# Patient Record
Sex: Female | Born: 1985 | Race: Black or African American | Hispanic: No | Marital: Single | Smoking: Light tobacco smoker
Health system: Southern US, Community
[De-identification: ages and names within clinical notes are randomized; demographics above are authoritative.]

## PROBLEM LIST (undated history)

## (undated) ENCOUNTER — Inpatient Hospital Stay (HOSPITAL_COMMUNITY): Payer: Self-pay

## (undated) DIAGNOSIS — R51 Headache: Secondary | ICD-10-CM

## (undated) DIAGNOSIS — R87619 Unspecified abnormal cytological findings in specimens from cervix uteri: Secondary | ICD-10-CM

## (undated) DIAGNOSIS — IMO0002 Reserved for concepts with insufficient information to code with codable children: Secondary | ICD-10-CM

## (undated) DIAGNOSIS — D649 Anemia, unspecified: Secondary | ICD-10-CM

## (undated) HISTORY — PX: WISDOM TOOTH EXTRACTION: SHX21

## (undated) HISTORY — PX: NO PAST SURGERIES: SHX2092

---
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
## 2007-02-09 ENCOUNTER — Emergency Department (HOSPITAL_COMMUNITY): Admission: EM | Admit: 2007-02-09 | Discharge: 2007-02-09 | Payer: Self-pay | Admitting: Emergency Medicine

## 2007-06-11 ENCOUNTER — Emergency Department (HOSPITAL_COMMUNITY): Admission: EM | Admit: 2007-06-11 | Discharge: 2007-06-11 | Payer: Self-pay | Admitting: Emergency Medicine

## 2007-08-19 ENCOUNTER — Emergency Department (HOSPITAL_COMMUNITY): Admission: EM | Admit: 2007-08-19 | Discharge: 2007-08-19 | Payer: Self-pay | Admitting: Neurosurgery

## 2007-10-05 ENCOUNTER — Emergency Department (HOSPITAL_COMMUNITY): Admission: EM | Admit: 2007-10-05 | Discharge: 2007-10-05 | Payer: Self-pay | Admitting: Family Medicine

## 2008-07-14 ENCOUNTER — Emergency Department (HOSPITAL_COMMUNITY): Admission: EM | Admit: 2008-07-14 | Discharge: 2008-07-15 | Payer: Self-pay | Admitting: Emergency Medicine

## 2008-07-16 ENCOUNTER — Encounter: Payer: Self-pay | Admitting: Emergency Medicine

## 2008-07-17 ENCOUNTER — Ambulatory Visit: Payer: Self-pay | Admitting: Gynecology

## 2008-07-17 ENCOUNTER — Inpatient Hospital Stay (HOSPITAL_COMMUNITY): Admission: AD | Admit: 2008-07-17 | Discharge: 2008-07-18 | Payer: Self-pay | Admitting: Gynecology

## 2008-07-25 ENCOUNTER — Ambulatory Visit: Payer: Self-pay | Admitting: Gynecology

## 2008-08-03 ENCOUNTER — Ambulatory Visit: Payer: Self-pay | Admitting: Gynecology

## 2008-09-17 ENCOUNTER — Emergency Department (HOSPITAL_COMMUNITY): Admission: EM | Admit: 2008-09-17 | Discharge: 2008-09-17 | Payer: Self-pay | Admitting: Emergency Medicine

## 2008-10-05 ENCOUNTER — Inpatient Hospital Stay (HOSPITAL_COMMUNITY): Admission: AD | Admit: 2008-10-05 | Discharge: 2008-10-06 | Payer: Self-pay | Admitting: Obstetrics & Gynecology

## 2008-10-08 ENCOUNTER — Inpatient Hospital Stay (HOSPITAL_COMMUNITY): Admission: AD | Admit: 2008-10-08 | Discharge: 2008-10-08 | Payer: Self-pay | Admitting: Obstetrics & Gynecology

## 2009-07-16 ENCOUNTER — Ambulatory Visit (HOSPITAL_COMMUNITY): Admission: RE | Admit: 2009-07-16 | Discharge: 2009-07-16 | Payer: Self-pay | Admitting: Obstetrics & Gynecology

## 2009-08-30 ENCOUNTER — Ambulatory Visit (HOSPITAL_COMMUNITY): Admission: RE | Admit: 2009-08-30 | Discharge: 2009-08-30 | Payer: Self-pay | Admitting: Family Medicine

## 2009-11-08 ENCOUNTER — Emergency Department (HOSPITAL_COMMUNITY): Admission: EM | Admit: 2009-11-08 | Discharge: 2009-11-09 | Payer: Self-pay | Admitting: Emergency Medicine

## 2010-01-15 ENCOUNTER — Ambulatory Visit: Payer: Self-pay | Admitting: Advanced Practice Midwife

## 2010-01-15 ENCOUNTER — Inpatient Hospital Stay (HOSPITAL_COMMUNITY): Admission: AD | Admit: 2010-01-15 | Discharge: 2010-01-17 | Payer: Self-pay | Admitting: Obstetrics & Gynecology

## 2010-03-25 ENCOUNTER — Emergency Department (HOSPITAL_COMMUNITY)
Admission: EM | Admit: 2010-03-25 | Discharge: 2010-03-25 | Payer: Self-pay | Source: Home / Self Care | Admitting: Family Medicine

## 2010-04-07 ENCOUNTER — Encounter: Payer: Self-pay | Admitting: Emergency Medicine

## 2010-04-07 ENCOUNTER — Encounter: Payer: Self-pay | Admitting: Gynecology

## 2010-05-27 LAB — CBC
Hemoglobin: 10.1 g/dL — ABNORMAL LOW (ref 12.0–15.0)
MCH: 31.9 pg (ref 26.0–34.0)
Platelets: 147 10*3/uL — ABNORMAL LOW (ref 150–400)
RBC: 3.17 MIL/uL — ABNORMAL LOW (ref 3.87–5.11)
RDW: 13 % (ref 11.5–15.5)
WBC: 18.6 10*3/uL — ABNORMAL HIGH (ref 4.0–10.5)

## 2010-05-27 LAB — RPR: RPR Ser Ql: NONREACTIVE

## 2010-06-22 LAB — WET PREP, GENITAL
Clue Cells Wet Prep HPF POC: NONE SEEN
Trich, Wet Prep: NONE SEEN

## 2010-06-22 LAB — COMPREHENSIVE METABOLIC PANEL
ALT: 11 U/L (ref 0–35)
Alkaline Phosphatase: 56 U/L (ref 39–117)
BUN: 8 mg/dL (ref 6–23)
CO2: 23 mEq/L (ref 19–32)
GFR calc non Af Amer: >60 mL/min (ref >60)
Glucose, Bld: 128 mg/dL — ABNORMAL HIGH (ref 70–99)
Potassium: 3.2 mEq/L — ABNORMAL LOW (ref 3.5–5.1)
Sodium: 136 mEq/L (ref 135–145)

## 2010-06-22 LAB — DIFFERENTIAL
Basophils Absolute: 0 10*3/uL (ref 0.0–0.1)
Eosinophils Absolute: 0 10*3/uL (ref 0.0–0.7)
Lymphs Abs: 0.4 10*3/uL — ABNORMAL LOW (ref 0.7–4.0)
Monocytes Absolute: 0.2 10*3/uL (ref 0.1–1.0)
Neutrophils Relative %: 97 % — ABNORMAL HIGH (ref 43–77)
WBC Morphology: INCREASED

## 2010-06-22 LAB — URINALYSIS, ROUTINE W REFLEX MICROSCOPIC
Bilirubin Urine: NEGATIVE
Glucose, UA: NEGATIVE mg/dL
Hgb urine dipstick: NEGATIVE
Ketones, ur: NEGATIVE mg/dL
Nitrite: NEGATIVE
Protein, ur: 100 mg/dL — AB
Specific Gravity, Urine: 1.02 (ref 1.005–1.030)
Specific Gravity, Urine: 1.046 — ABNORMAL HIGH (ref 1.005–1.030)
Urobilinogen, UA: 0.2 mg/dL (ref 0.0–1.0)
Urobilinogen, UA: 1 mg/dL (ref 0.0–1.0)
pH: 7.5 (ref 5.0–8.0)

## 2010-06-22 LAB — GC/CHLAMYDIA PROBE AMP, GENITAL
Chlamydia, DNA Probe: NEGATIVE
GC Probe Amp, Genital: NEGATIVE
GC Probe Amp, Genital: POSITIVE — AB

## 2010-06-22 LAB — CBC
HCT: 38.6 % (ref 36.0–46.0)
Hemoglobin: 13.1 g/dL (ref 12.0–15.0)
MCHC: 34.1 g/dL (ref 30.0–36.0)
MCHC: 34 g/dL (ref 30.0–36.0)
MCV: 91.2 fL (ref 78.0–100.0)
Platelets: 289 10*3/uL (ref 150–400)
RBC: 4.24 MIL/uL (ref 3.87–5.11)
RDW: 14.1 % (ref 11.5–15.5)

## 2010-06-22 LAB — URINE MICROSCOPIC-ADD ON

## 2010-06-22 LAB — POCT PREGNANCY, URINE
Preg Test, Ur: NEGATIVE
Preg Test, Ur: NEGATIVE

## 2010-06-24 LAB — URINE CULTURE

## 2010-06-24 LAB — GC/CHLAMYDIA PROBE AMP, GENITAL
Chlamydia, DNA Probe: NEGATIVE
GC Probe Amp, Genital: POSITIVE — AB

## 2010-06-24 LAB — HIV ANTIBODY (ROUTINE TESTING W REFLEX): HIV: NONREACTIVE

## 2010-06-24 LAB — URINALYSIS, ROUTINE W REFLEX MICROSCOPIC
Glucose, UA: NEGATIVE mg/dL
Hgb urine dipstick: NEGATIVE
Ketones, ur: 15 mg/dL — AB
Ketones, ur: 15 mg/dL — AB
Protein, ur: 100 mg/dL — AB
Urobilinogen, UA: 1 mg/dL (ref 0.0–1.0)
pH: 7 (ref 5.0–8.0)

## 2010-06-24 LAB — URINE MICROSCOPIC-ADD ON

## 2010-06-24 LAB — COMPREHENSIVE METABOLIC PANEL
Albumin: 3.9 g/dL (ref 3.5–5.2)
BUN: 10 mg/dL (ref 6–23)
Chloride: 98 mEq/L (ref 96–112)
Creatinine, Ser: 0.95 mg/dL (ref 0.4–1.2)
GFR calc non Af Amer: >60 mL/min (ref >60)
Total Bilirubin: 1.2 mg/dL (ref 0.3–1.2)

## 2010-06-24 LAB — PREGNANCY, URINE: Preg Test, Ur: NEGATIVE

## 2010-06-24 LAB — CBC
HCT: 30.9 % — ABNORMAL LOW (ref 36.0–46.0)
HCT: 39.6 % (ref 36.0–46.0)
MCHC: 34.2 g/dL (ref 30.0–36.0)
MCV: 90.8 fL (ref 78.0–100.0)
Platelets: 234 10*3/uL (ref 150–400)
Platelets: 246 10*3/uL (ref 150–400)
RDW: 13.5 % (ref 11.5–15.5)
WBC: 23.2 10*3/uL — ABNORMAL HIGH (ref 4.0–10.5)

## 2010-06-24 LAB — WET PREP, GENITAL: Yeast Wet Prep HPF POC: NONE SEEN

## 2010-06-24 LAB — POCT PREGNANCY, URINE: Preg Test, Ur: NEGATIVE

## 2010-06-24 LAB — RPR: RPR Ser Ql: NONREACTIVE

## 2010-06-24 LAB — DIFFERENTIAL
Basophils Absolute: 0 10*3/uL (ref 0.0–0.1)
Lymphocytes Relative: 4 % — ABNORMAL LOW (ref 12–46)
Monocytes Relative: 4 % (ref 3–12)
WBC Morphology: INCREASED

## 2010-07-29 NOTE — H&P (Signed)
[name], [name]              ACCOUNT NO.:  [id number]   MEDICAL RECORD NO.:  [id number]          PATIENT TYPE:  INP   LOCATION:  9317                          FACILITY:  [facility]   PHYSICIAN:  Juan H. Lily Peer, M.D.DATE OF BIRTH:  [birth year]   DATE OF ADMISSION:  07/17/2008  DATE OF DISCHARGE:                              HISTORY & PHYSICAL   CHIEF COMPLAINT:  Lower abdominal pain.   HISTORY:  The patient is a 24 year old gravida 1, para 1, who had been  seen at [facility], 2 days prior to the admission to the  hospital and was treated for suspected urinary tract infection and at  that time, she had received a wet prep, GC, and chlamydia culture and  chlamydia culture were not available and we did not find out until she  presented back to the hospital on Saturday morning, complaining of  worsening of low pelvic pain and her GC cultures found to be positive.  She was found to have a white blood count of 23,000 with 92% neutrophil.  Her comprehensive metabolic panel was normal.  Her urine demonstrated  some blood, some trace bilirubin, ketones, and leukoesterase of small  amount.  Urine pregnancy test had been normal.  She had a CT of the  abdomen and pelvis.  The lung base appeared to be otherwise normal.  The  rest of the abdominal CT was normal.  There is a suspicion with mild  mesenteric edema.  The patient had administration rectal contrast to  identify the appendix.  It was not able to be visualized in the previous  pelvic CT scan.  The appendix appeared to be within normal limits in  size.  The distal small-bowel was unremarkable.  The remainder of the  colons reported to be normal and moderate fluid was seen again, and the  uterus and adnexa appeared to be otherwise unremarkable.  Of note, the  patient when she was discharged on the first day in the emergency room,  we will suspect a UTI and bacterial vaginosis.  She had been placed on  ciprofloxacin and Flagyl,  which stated it upset her stomach.  The  patient had stable vital signs, temperature was 100.3 in the emergency  room at [facility] and she was transferred to [facility] for further  treatment.  She did received while she was there, doxycycline 100 mg IV  and cefotetan 2 g IV.   PAST MEDICAL HISTORY:  She smokes cigarettes.  Denies alcohol usage.  She had been on Depo-Provera injectable contraception, but her last dose  was approximately 3-1/2 months ago, when she started she did not want to  use this formal contraception due to the fact that she had been gaining  weight.   PHYSICAL EXAMINATION:  GENERAL:  Well-developed, well-nourished female  in no apparent acute distress.  This morning her temperature is 98.2,  blood pressure 116/54, respiration 14, and pulse 98.  HEENT:  Unremarkable.  NECK:  Supple.  Trachea midline.  No carotid bruits or thyromegaly.  LUNGS:  Clear to auscultation without rhonchi or wheezes.  HEART:  Regular rate and rhythm.  No murmurs or gallops.  BREASTS:  Exam not done.  ABDOMEN:  Soft, tender, and bilateral, right greater than left.  Good  bowel sounds and poor quadrant.  Pelvic exam was not repeated and was done in the emergency room, which  had been demonstrated as normal external genitalia and beefy-looking  cervix with right adnexal tenderness and no discharge noted.  RECTAL:  Exam not done.  EXTREMITIES: No edema.   ASSESSMENT:  A 25 year old gravida 1, para 1, with full-blown PID.  Positive GC culture.  HIV and RPR was drawn this morning.  The patient  was started on Unasyn 3 g IV q.6 h. this morning along with doxycycline  100 mg b.i.d.  I had a discussion with the patient and her boyfriend  about the positive GC culture and that the above-mentioned antibiotics  will cover the above-mentioned organism as well as a broader coverage  that we anticipate her be in the hospital for 72 hours for intravenous  antibiotic and then to continue at home with  oral antibiotics for  approximately 2 weeks and be followed as an outpatient.  We will decide  of on Thursday morning.  We will do a pelvic ultrasound before discharge  home.  We will keep her at bedrest, continue to monitor vital signs,  continued the above-mentioned antibiotics, and will repeat her CBC  probably tomorrow morning and along with sed rate and follow  accordingly.   PLAN:  As per assessment above.      Juan H. Lily Peer, M.D.  Electronically Signed     JHF/MEDQ  D:  07/17/2008  T:  07/17/2008  Job:  [id number]

## 2010-12-12 LAB — POCT PREGNANCY, URINE
Operator id: 239701
Preg Test, Ur: NEGATIVE

## 2010-12-12 LAB — POCT URINALYSIS DIP (DEVICE)
Hgb urine dipstick: NEGATIVE
Ketones, ur: NEGATIVE
Protein, ur: NEGATIVE
pH: 7.5

## 2010-12-12 LAB — WET PREP, GENITAL

## 2010-12-12 LAB — GC/CHLAMYDIA PROBE AMP, GENITAL: Chlamydia, DNA Probe: POSITIVE — AB

## 2011-11-04 ENCOUNTER — Encounter (HOSPITAL_COMMUNITY): Payer: Self-pay | Admitting: *Deleted

## 2011-11-04 ENCOUNTER — Emergency Department (HOSPITAL_COMMUNITY)
Admission: EM | Admit: 2011-11-04 | Discharge: 2011-11-04 | Disposition: A | Discharge status: Home / Self Care | Payer: Medicaid Other | Source: Home / Self Care | Attending: Emergency Medicine | Admitting: Emergency Medicine

## 2011-11-04 DIAGNOSIS — Z3490 Encounter for supervision of normal pregnancy, unspecified, unspecified trimester: Secondary | ICD-10-CM

## 2011-11-04 DIAGNOSIS — Z331 Pregnant state, incidental: Secondary | ICD-10-CM

## 2011-11-04 LAB — POCT URINALYSIS DIP (DEVICE)
Hgb urine dipstick: NEGATIVE
Protein, ur: 30 mg/dL — AB
Specific Gravity, Urine: 1.02 (ref 1.005–1.030)
Urobilinogen, UA: 0.2 mg/dL (ref 0.0–1.0)
pH: 7 (ref 5.0–8.0)

## 2011-11-04 LAB — POCT PREGNANCY, URINE: Preg Test, Ur: POSITIVE — AB

## 2011-11-04 MED ORDER — PRENATAL MULTIVIT-IRON PO TABS: 1.0000 | ORAL_TABLET | ORAL | Status: DC

## 2011-11-04 NOTE — ED Provider Notes (Signed)
History     CSN: [id number]  Arrival date & time 11/04/11  1546   First MD Initiated Contact with Patient 11/04/11 1616      Chief Complaint  Patient presents with  . Possible Pregnancy    (Consider location/radiation/quality/duration/timing/severity/associated sxs/prior treatment) HPI Comments: Patient presents urgent care requesting to be tested for pregnancy she had a positive pregnancy test at home. About a week ago she says) requiring proof of pregnancy in order to the able to establish with an obstetrician for prenatal care. Patient admits to mild nausea. Denies any vaginal bleeding or abdominal pain.  The history is provided by the patient.    History reviewed. No pertinent past medical history.  History reviewed. No pertinent past surgical history.  No family history on file.  History  Substance Use Topics  . Smoking status: Not on file  . Smokeless tobacco: Not on file  . Alcohol Use: Not on file    OB History    Grav Para Term Preterm Abortions TAB SAB Ect Mult Living                  Review of Systems  Constitutional: Negative for fever, chills, activity change and fatigue.  Cardiovascular: Negative for chest pain.  Gastrointestinal: Negative for nausea and abdominal pain.  Genitourinary: Negative for dysuria, vaginal bleeding, vaginal discharge, menstrual problem and pelvic pain.  Musculoskeletal: Negative for back pain.    Allergies  Review of patient's allergies indicates no known allergies.  Home Medications   Current Outpatient Rx  Name Route Sig Dispense Refill  . PRENATAL MULTIVIT-IRON PO TABS Oral Take 1 tablet by mouth 1 day or 1 dose. 60 tablet 2    BP 121/76  Pulse 86  Temp 98.4 F (36.9 C) (Oral)  Resp 16  SpO2 98%  LMP 09/16/2011  Physical Exam  Nursing note and vitals reviewed. Constitutional: Vital signs are normal. She appears well-developed and well-nourished.  Non-toxic appearance. She does not have a sickly appearance.  She does not appear ill.  Abdominal: Soft. She exhibits no distension. There is no tenderness.  Neurological: She is alert.  Skin: Skin is warm.    ED Course  Procedures (including critical care time)  Labs Reviewed  POCT URINALYSIS DIP (DEVICE) - Abnormal; Notable for the following:    Protein, ur 30 (*)     Leukocytes, UA TRACE (*)  Biochemical Testing Only. Please order routine urinalysis from main lab if confirmatory testing is needed.   All other components within normal limits  POCT PREGNANCY, URINE - Abnormal; Notable for the following:    Preg Test, Ur POSITIVE (*)     All other components within normal limits   No results found.   1. Pregnancy       MDM  Uncomplicated early pregnancy. Patient is comfortable. Asymptomatic. Has plans to establish with prenatal care with [facility], started on prenatal vitamins. Patient was instructed about what symptoms should warrant her evaluation at the [facility] facility.        Jimmie Molly, MD 11/04/11 1729

## 2011-11-04 NOTE — ED Notes (Signed)
Pt  Reports  Late on  Her  Period  And     Had  A  Pos  preg test  At  Home  -  She  Is  Here  Today  C/o  Some  wekness  But  No   Vomiting no bleeding or  Any pain     She  Needs  Documentation   To  [facility]

## 2011-11-17 LAB — OB RESULTS CONSOLE ANTIBODY SCREEN: Antibody Screen: NEGATIVE

## 2011-11-17 LAB — OB RESULTS CONSOLE HEPATITIS B SURFACE ANTIGEN: Hepatitis B Surface Ag: NEGATIVE

## 2011-11-17 LAB — OB RESULTS CONSOLE RPR: RPR: NONREACTIVE

## 2011-11-24 ENCOUNTER — Other Ambulatory Visit: Payer: Self-pay | Admitting: Obstetrics & Gynecology

## 2012-03-16 NOTE — L&D Delivery Note (Signed)
Delivery Note At 10:31 AM a viable female was delivered via Vaginal, Spontaneous Delivery (Presentation: Right Occiput Anterior).  APGAR: , ; weight .   Placenta status: Intact, Manual removal.  Cord: 3 vessels with the following complications: None.  Cord pH: not done  Anesthesia: None  Episiotomy: None Lacerations: None Suture Repair: 2.0 Est. Blood Loss (mL): 350  Mom to postpartum.  Baby to nursery-stable.  MARSHALL,BERNARD A 06/11/2012, 10:48 AM

## 2012-06-11 ENCOUNTER — Encounter (HOSPITAL_COMMUNITY)
Admission: AD | Disposition: A | Discharge status: Home / Self Care | Payer: Self-pay | Source: Ambulatory Visit | Attending: Obstetrics

## 2012-06-11 ENCOUNTER — Encounter (HOSPITAL_COMMUNITY): Payer: Self-pay | Admitting: *Deleted

## 2012-06-11 ENCOUNTER — Inpatient Hospital Stay (HOSPITAL_COMMUNITY): Payer: Medicaid Other

## 2012-06-11 ENCOUNTER — Inpatient Hospital Stay (HOSPITAL_COMMUNITY)
Admission: AD | Admit: 2012-06-11 | Discharge: 2012-06-13 | DRG: 767 | Disposition: A | Discharge status: Home / Self Care | Payer: Medicaid Other | Source: Ambulatory Visit | Attending: Obstetrics | Admitting: Obstetrics

## 2012-06-11 ENCOUNTER — Encounter (HOSPITAL_COMMUNITY): Payer: Self-pay

## 2012-06-11 DIAGNOSIS — O9903 Anemia complicating the puerperium: Secondary | ICD-10-CM | POA: Diagnosis not present

## 2012-06-11 DIAGNOSIS — D62 Acute posthemorrhagic anemia: Secondary | ICD-10-CM | POA: Diagnosis not present

## 2012-06-11 HISTORY — DX: Unspecified abnormal cytological findings in specimens from cervix uteri: R87.619

## 2012-06-11 HISTORY — PX: DILATION AND EVACUATION: SHX1459

## 2012-06-11 HISTORY — DX: Reserved for concepts with insufficient information to code with codable children: IMO0002

## 2012-06-11 HISTORY — DX: Headache: R51

## 2012-06-11 LAB — CBC
HCT: 36.3 % (ref 36.0–46.0)
HCT: 30.8 % — ABNORMAL LOW (ref 36.0–46.0)
Hemoglobin: 12.6 g/dL (ref 12.0–15.0)
Hemoglobin: 10.4 g/dL — ABNORMAL LOW (ref 12.0–15.0)
MCH: 31.2 pg (ref 26.0–34.0)
MCHC: 34.7 g/dL (ref 30.0–36.0)
MCHC: 33.8 g/dL (ref 30.0–36.0)
MCV: 89.9 fL (ref 78.0–100.0)
RBC: 3.38 MIL/uL — ABNORMAL LOW (ref 3.87–5.11)
RDW: 13.3 % (ref 11.5–15.5)

## 2012-06-11 LAB — PREPARE RBC (CROSSMATCH)

## 2012-06-11 LAB — MRSA PCR SCREENING: MRSA by PCR: NEGATIVE

## 2012-06-11 SURGERY — DILATION AND EVACUATION, UTERUS
Anesthesia: Monitor Anesthesia Care | Site: Uterus | Wound class: Clean Contaminated

## 2012-06-11 MED ORDER — MEPERIDINE HCL 25 MG/ML IJ SOLN: 6.2500 mg | INTRAMUSCULAR | Status: DC | PRN

## 2012-06-11 MED ORDER — ONDANSETRON HCL 4 MG PO TABS: 4.0000 mg | ORAL_TABLET | ORAL | Status: DC | PRN

## 2012-06-11 MED ORDER — LANOLIN HYDROUS EX OINT: TOPICAL_OINTMENT | CUTANEOUS | Status: DC | PRN

## 2012-06-11 MED ORDER — OXYTOCIN BOLUS FROM INFUSION: 500.0000 mL | INTRAVENOUS | Status: DC

## 2012-06-11 MED ORDER — MIDAZOLAM HCL 5 MG/5ML IJ SOLN
INTRAMUSCULAR | Status: DC | PRN
  Administered 2012-06-11 (×2): 2 mg via INTRAVENOUS

## 2012-06-11 MED ORDER — PROMETHAZINE HCL 25 MG/ML IJ SOLN: 6.2500 mg | INTRAMUSCULAR | Status: DC | PRN

## 2012-06-11 MED ORDER — LACTATED RINGERS IV SOLN: 500.0000 mL | INTRAVENOUS | Status: DC | PRN

## 2012-06-11 MED ORDER — METHYLERGONOVINE MALEATE 0.2 MG/ML IJ SOLN
INTRAMUSCULAR | Status: AC
  Administered 2012-06-11: 0.2 mg via INTRAMUSCULAR
  Filled 2012-06-11: qty 1

## 2012-06-11 MED ORDER — FERROUS SULFATE 325 (65 FE) MG PO TABS
325.0000 mg | ORAL_TABLET | Freq: Two times a day (BID) | ORAL | Status: DC
  Administered 2012-06-11 – 2012-06-12 (×2): 325 mg via ORAL
  Filled 2012-06-11 (×2): qty 1

## 2012-06-11 MED ORDER — TETANUS-DIPHTH-ACELL PERTUSSIS 5-2.5-18.5 LF-MCG/0.5 IM SUSP
0.5000 mL | Freq: Once | INTRAMUSCULAR | Status: AC
  Administered 2012-06-12: 0.5 mL via INTRAMUSCULAR
  Filled 2012-06-11: qty 0.5

## 2012-06-11 MED ORDER — IBUPROFEN 600 MG PO TABS
600.0000 mg | ORAL_TABLET | Freq: Four times a day (QID) | ORAL | Status: DC | PRN
  Filled 2012-06-11 (×3): qty 1

## 2012-06-11 MED ORDER — LIDOCAINE HCL (PF) 1 % IJ SOLN: 30.0000 mL | INTRAMUSCULAR | Status: DC | PRN

## 2012-06-11 MED ORDER — LACTATED RINGERS IV SOLN
INTRAVENOUS | Status: DC
  Administered 2012-06-11: 09:00:00 via INTRAVENOUS

## 2012-06-11 MED ORDER — CITRIC ACID-SODIUM CITRATE 334-500 MG/5ML PO SOLN
30.0000 mL | ORAL | Status: DC | PRN
  Filled 2012-06-11: qty 15

## 2012-06-11 MED ORDER — PROPOFOL 10 MG/ML IV EMUL
INTRAVENOUS | Status: DC | PRN
  Administered 2012-06-11 (×2): 20 mg via INTRAVENOUS
  Administered 2012-06-11 (×3): 40 mg via INTRAVENOUS
  Administered 2012-06-11 (×2): 20 mg via INTRAVENOUS
  Administered 2012-06-11: 40 mg via INTRAVENOUS

## 2012-06-11 MED ORDER — CHLOROPROCAINE HCL 1 % IJ SOLN
INTRAMUSCULAR | Status: AC
  Filled 2012-06-11: qty 60

## 2012-06-11 MED ORDER — BENZOCAINE-MENTHOL 20-0.5 % EX AERO: 1.0000 "application " | INHALATION_SPRAY | CUTANEOUS | Status: DC | PRN

## 2012-06-11 MED ORDER — ONDANSETRON HCL 4 MG/2ML IJ SOLN
INTRAMUSCULAR | Status: DC | PRN
  Administered 2012-06-11: 4 mg via INTRAVENOUS

## 2012-06-11 MED ORDER — HYDROMORPHONE HCL PF 1 MG/ML IJ SOLN: 0.2500 mg | INTRAMUSCULAR | Status: DC | PRN

## 2012-06-11 MED ORDER — PRENATAL MULTIVITAMIN CH
1.0000 | ORAL_TABLET | Freq: Every day | ORAL | Status: DC
  Administered 2012-06-12: 1 via ORAL
  Filled 2012-06-11: qty 1

## 2012-06-11 MED ORDER — DIBUCAINE 1 % RE OINT: 1.0000 "application " | TOPICAL_OINTMENT | RECTAL | Status: DC | PRN

## 2012-06-11 MED ORDER — OXYCODONE-ACETAMINOPHEN 5-325 MG PO TABS
1.0000 | ORAL_TABLET | ORAL | Status: DC | PRN
  Administered 2012-06-11 – 2012-06-12 (×2): 2 via ORAL
  Administered 2012-06-12: 1 via ORAL
  Filled 2012-06-11 (×2): qty 1

## 2012-06-11 MED ORDER — IBUPROFEN 600 MG PO TABS
600.0000 mg | ORAL_TABLET | Freq: Four times a day (QID) | ORAL | Status: DC
  Administered 2012-06-11 – 2012-06-13 (×7): 600 mg via ORAL
  Filled 2012-06-11 (×4): qty 1

## 2012-06-11 MED ORDER — FAMOTIDINE 20 MG PO TABS: 20.0000 mg | ORAL_TABLET | Freq: Every day | ORAL | Status: DC

## 2012-06-11 MED ORDER — FLEET ENEMA 7-19 GM/118ML RE ENEM: 1.0000 | ENEMA | RECTAL | Status: DC | PRN

## 2012-06-11 MED ORDER — PHENYLEPHRINE HCL 10 MG/ML IJ SOLN
INTRAMUSCULAR | Status: DC | PRN
  Administered 2012-06-11 (×3): 40 mg via INTRAVENOUS

## 2012-06-11 MED ORDER — ONDANSETRON HCL 4 MG/2ML IJ SOLN: 4.0000 mg | INTRAMUSCULAR | Status: DC | PRN

## 2012-06-11 MED ORDER — MIDAZOLAM HCL 2 MG/2ML IJ SOLN
INTRAMUSCULAR | Status: AC
  Filled 2012-06-11: qty 2

## 2012-06-11 MED ORDER — FENTANYL CITRATE 0.05 MG/ML IJ SOLN
INTRAMUSCULAR | Status: DC | PRN
  Administered 2012-06-11 (×5): 50 ug via INTRAVENOUS

## 2012-06-11 MED ORDER — OXYTOCIN 40 UNITS IN LACTATED RINGERS INFUSION - SIMPLE MED
62.5000 mL/h | INTRAVENOUS | Status: DC
  Administered 2012-06-11: 62.5 mL/h via INTRAVENOUS

## 2012-06-11 MED ORDER — LACTATED RINGERS IV SOLN
INTRAVENOUS | Status: DC
  Administered 2012-06-11: via INTRAVENOUS

## 2012-06-11 MED ORDER — PROPOFOL 10 MG/ML IV EMUL
INTRAVENOUS | Status: AC
  Filled 2012-06-11: qty 20

## 2012-06-11 MED ORDER — CHLOROPROCAINE HCL 3 % IJ SOLN
INTRAMUSCULAR | Status: DC | PRN
  Administered 2012-06-11: 40 mL

## 2012-06-11 MED ORDER — LACTATED RINGERS IV BOLUS (SEPSIS): 500.0000 mL | Freq: Once | INTRAVENOUS | Status: DC

## 2012-06-11 MED ORDER — SODIUM CHLORIDE 0.9 % IJ SOLN
15.0000 mL | INTRAMUSCULAR | Status: DC | PRN
Start: 2012-06-11 — End: 2012-06-13

## 2012-06-11 MED ORDER — 0.9 % SODIUM CHLORIDE (POUR BTL) OPTIME
TOPICAL | Status: DC | PRN
  Administered 2012-06-11: 1000 mL

## 2012-06-11 MED ORDER — DIPHENHYDRAMINE HCL 25 MG PO CAPS: 25.0000 mg | ORAL_CAPSULE | Freq: Four times a day (QID) | ORAL | Status: DC | PRN

## 2012-06-11 MED ORDER — FENTANYL CITRATE 0.05 MG/ML IJ SOLN
INTRAMUSCULAR | Status: AC
  Filled 2012-06-11: qty 5

## 2012-06-11 MED ORDER — LACTATED RINGERS IV SOLN
INTRAVENOUS | Status: DC | PRN
  Administered 2012-06-11 (×3): via INTRAVENOUS

## 2012-06-11 MED ORDER — CEFAZOLIN SODIUM-DEXTROSE 2-3 GM-% IV SOLR
INTRAVENOUS | Status: DC | PRN
  Administered 2012-06-11: 2 g via INTRAVENOUS

## 2012-06-11 MED ORDER — PENICILLIN G POTASSIUM 5000000 UNITS IJ SOLR
2.5000 10*6.[IU] | INTRAVENOUS | Status: DC
  Filled 2012-06-11 (×5): qty 2.5

## 2012-06-11 MED ORDER — ACETAMINOPHEN 325 MG PO TABS: 650.0000 mg | ORAL_TABLET | ORAL | Status: DC | PRN

## 2012-06-11 MED ORDER — WITCH HAZEL-GLYCERIN EX PADS: 1.0000 "application " | MEDICATED_PAD | CUTANEOUS | Status: DC | PRN

## 2012-06-11 MED ORDER — BUTORPHANOL TARTRATE 1 MG/ML IJ SOLN
1.0000 mg | INTRAMUSCULAR | Status: DC | PRN
  Administered 2012-06-11: 1 mg via INTRAVENOUS
  Filled 2012-06-11: qty 1

## 2012-06-11 MED ORDER — ZOLPIDEM TARTRATE 5 MG PO TABS: 5.0000 mg | ORAL_TABLET | Freq: Every evening | ORAL | Status: DC | PRN

## 2012-06-11 MED ORDER — SENNOSIDES-DOCUSATE SODIUM 8.6-50 MG PO TABS
2.0000 | ORAL_TABLET | Freq: Every day | ORAL | Status: DC
  Administered 2012-06-11 – 2012-06-12 (×2): 2 via ORAL

## 2012-06-11 MED ORDER — PHENYLEPHRINE 40 MCG/ML (10ML) SYRINGE FOR IV PUSH (FOR BLOOD PRESSURE SUPPORT)
PREFILLED_SYRINGE | INTRAVENOUS | Status: AC
  Administered 2012-06-11: 40 ug
  Filled 2012-06-11: qty 5

## 2012-06-11 MED ORDER — SIMETHICONE 80 MG PO CHEW: 80.0000 mg | CHEWABLE_TABLET | ORAL | Status: DC | PRN

## 2012-06-11 MED ORDER — CITRIC ACID-SODIUM CITRATE 334-500 MG/5ML PO SOLN
30.0000 mL | Freq: Once | ORAL | Status: AC
  Administered 2012-06-11: 30 mL via ORAL

## 2012-06-11 MED ORDER — KETOROLAC TROMETHAMINE 30 MG/ML IJ SOLN: 15.0000 mg | Freq: Once | INTRAMUSCULAR | Status: DC | PRN

## 2012-06-11 MED ORDER — PENICILLIN G POTASSIUM 5000000 UNITS IJ SOLR
5.0000 10*6.[IU] | Freq: Once | INTRAVENOUS | Status: AC
  Administered 2012-06-11: 5 10*6.[IU] via INTRAVENOUS
  Filled 2012-06-11: qty 5

## 2012-06-11 MED ORDER — CEFAZOLIN SODIUM-DEXTROSE 2-3 GM-% IV SOLR
INTRAVENOUS | Status: AC
  Filled 2012-06-11: qty 50

## 2012-06-11 MED ORDER — OXYCODONE-ACETAMINOPHEN 5-325 MG PO TABS
1.0000 | ORAL_TABLET | ORAL | Status: DC | PRN
  Administered 2012-06-11 (×2): 1 via ORAL
  Filled 2012-06-11: qty 1
  Filled 2012-06-11 (×3): qty 2

## 2012-06-11 MED ORDER — OXYTOCIN 40 UNITS IN LACTATED RINGERS INFUSION - SIMPLE MED
INTRAVENOUS | Status: AC
  Filled 2012-06-11: qty 1000

## 2012-06-11 MED ORDER — SODIUM CHLORIDE 0.9 % IR SOLN: 10.0000 mL | Status: DC | PRN

## 2012-06-11 MED ORDER — ONDANSETRON HCL 4 MG/2ML IJ SOLN: 4.0000 mg | Freq: Four times a day (QID) | INTRAMUSCULAR | Status: DC | PRN

## 2012-06-11 SURGICAL SUPPLY — 31 items
ADAPTER VACURETTE TBG SET 14 (CANNULA) ×2 IMPLANT
BAG URINE DRAINAGE (UROLOGICAL SUPPLIES) ×1 IMPLANT
BALLN POSTPARTUM SOS BAKRI (BALLOONS) ×4
BALLOON POSTPARTUM SOS BAKRI (BALLOONS) IMPLANT
CATH ROBINSON RED A/P 16FR (CATHETERS) ×1 IMPLANT
CLOTH BEACON ORANGE TIMEOUT ST (SAFETY) ×2 IMPLANT
DECANTER SPIKE VIAL GLASS SM (MISCELLANEOUS) ×2 IMPLANT
ELECT REM PT RETURN 9FT ADLT (ELECTROSURGICAL) ×2
ELECTRODE REM PT RTRN 9FT ADLT (ELECTROSURGICAL) IMPLANT
GAUZE SPONGE 4X4 16PLY XRAY LF (GAUZE/BANDAGES/DRESSINGS) ×1 IMPLANT
GLOVE BIO SURGEON STRL SZ8.5 (GLOVE) ×2 IMPLANT
GOWN PREVENTION PLUS XXLARGE (GOWN DISPOSABLE) ×2 IMPLANT
GOWN STRL REIN XL XLG (GOWN DISPOSABLE) ×4 IMPLANT
KIT BERKELEY 1ST TRIMESTER 3/8 (MISCELLANEOUS) ×2 IMPLANT
NDL SPNL 22GX3.5 QUINCKE BK (NEEDLE) ×1 IMPLANT
NEEDLE SPNL 22GX3.5 QUINCKE BK (NEEDLE) ×2 IMPLANT
NS IRRIG 1000ML POUR BTL (IV SOLUTION) ×2 IMPLANT
PACK VAGINAL MINOR WOMEN LF (CUSTOM PROCEDURE TRAY) ×2 IMPLANT
PAD OB MATERNITY 4.3X12.25 (PERSONAL CARE ITEMS) ×2 IMPLANT
PAD PREP 24X48 CUFFED NSTRL (MISCELLANEOUS) ×2 IMPLANT
PENCIL BUTTON HOLSTER BLD 10FT (ELECTRODE) ×1 IMPLANT
SET BERKELEY SUCTION TUBING (SUCTIONS) ×2 IMPLANT
SYR CONTROL 10ML LL (SYRINGE) ×2 IMPLANT
TOWEL OR 17X24 6PK STRL BLUE (TOWEL DISPOSABLE) ×4 IMPLANT
TUBING NON-CON 1/4 X 20 CONN (TUBING) ×1 IMPLANT
VACURETTE 10 RIGID CVD (CANNULA) IMPLANT
VACURETTE 12 RIGID CVD (CANNULA) ×1 IMPLANT
VACURETTE 7MM CVD STRL WRAP (CANNULA) IMPLANT
VACURETTE 8 RIGID CVD (CANNULA) IMPLANT
VACURETTE 9 RIGID CVD (CANNULA) IMPLANT
YANKAUER SUCT BULB TIP NO VENT (SUCTIONS) ×1 IMPLANT

## 2012-06-11 NOTE — Progress Notes (Signed)
Amount adjusted per Dr. Gaynell Face EBL

## 2012-06-11 NOTE — Progress Notes (Signed)
By the nurse at 11:15 AM stating that patient had heavy bleeding pelvic exam was done the fundus firm lochia300 cc of clots removed from the vagina and the patient observed 20 minutes later she still had significant bleeding vital signs were normal but gradually her blood pressure came down to 80 50 at that   and the postpartum hemorrhage  protocal was began she was typed and crossed for 2 units of packed cells and CBC performed and  another IV was started in her right  Arm  in 1000 cc of normal saline administered and the patient prepped for the operating room

## 2012-06-11 NOTE — MAU Note (Signed)
Contractions during the night, not able to sleep.  No bleeding or leaking.  Denies prob with preg.

## 2012-06-11 NOTE — H&P (Signed)
This is Dr. Francoise Ceo dictating the history and physical on blank blank she's a 27 year old gravida 3 para 002 EDC 06/22/2012 at 14 and 5 GBS unknown she received penicillin she was admitted in labor 9 cm 100% rapid progress normal and delivery by the midline placenta spontaneous intact by me there were no lacerations and she was delivered of a female team in attendance Apgar unknown at this time Past medical history negative Past surgical history negative Social history negative System review noncontributory Physical exam well-developed female post delivery HEENT negative Breasts negative Heart regular rhythm no murmurs no gallops Abdomen uterus 20 week postpartum size Pelvic as described above Extremities negative and

## 2012-06-11 NOTE — Anesthesia Postprocedure Evaluation (Signed)
Anesthesia Post Note  Patient: [name]  Procedure(s) Performed: Procedure(s) (LRB): DILATATION AND EVACUATION (N/A)  Anesthesia type: MAC  Patient location: PACU  Post pain: Pain level controlled  Post assessment: Post-op Vital signs reviewed  Last Vitals:  Filed Vitals:   06/11/12 1430  BP: 130/68  Pulse: 62  Temp: 37.1 C  Resp: 12    Post vital signs: Reviewed  Level of consciousness: sedated  Complications: No apparent anesthesia complications

## 2012-06-11 NOTE — Transfer of Care (Signed)
Immediate Anesthesia Transfer of Care Note  Patient: [name]  Procedure(s) Performed: Procedure(s): DILATATION AND EVACUATION (N/A)  Patient Location: PACU  Anesthesia Type:MAC  Level of Consciousness: awake, alert  and oriented  Airway & Oxygen Therapy: Patient Spontanous Breathing and Patient connected to nasal cannula oxygen  Post-op Assessment: Report given to PACU RN and Post -op Vital signs reviewed and stable  Post vital signs: Reviewed and stable  Complications: No apparent anesthesia complications

## 2012-06-11 NOTE — MAU Note (Signed)
Called L&D and then pharmacy looking for PCN.

## 2012-06-11 NOTE — Anesthesia Postprocedure Evaluation (Signed)
  Anesthesia Post-op Note  Patient: [name]  Procedure(s) Performed: Procedure(s) with comments: DILATATION AND EVACUATION (N/A) - with insertion Bakri Balloon  - Emergency  Patient Location: ICU  Anesthesia Type:MAC  Level of Consciousness: awake  Airway and Oxygen Therapy: Patient Spontanous Breathing  Post-op Pain: mild  Post-op Assessment: Patient's Cardiovascular Status Stable and Respiratory Function Stable  Post-op Vital Signs: stable  Complications: No apparent anesthesia complications

## 2012-06-11 NOTE — Anesthesia Preprocedure Evaluation (Signed)
Anesthesia Evaluation  Patient identified by MRN, date of birth, ID band Patient awake    Reviewed: Allergy & Precautions, H&P , NPO status , Patient's Chart, lab work & pertinent test results, reviewed documented beta blocker date and time   Airway Mallampati: I TM Distance: >3 FB Neck ROM: full    Dental no notable dental hx. (+) Teeth Intact   Pulmonary neg pulmonary ROS,    Pulmonary exam normal       Cardiovascular negative cardio ROS      Neuro/Psych negative psych ROS   GI/Hepatic negative GI ROS, Neg liver ROS,   Endo/Other  negative endocrine ROS  Renal/GU negative Renal ROS  negative genitourinary   Musculoskeletal negative musculoskeletal ROS (+)   Abdominal Normal abdominal exam  (+)   Peds negative pediatric ROS (+)  Hematology negative hematology ROS (+)   Anesthesia Other Findings   Reproductive/Obstetrics                           Anesthesia Physical Anesthesia Plan  ASA: II and emergent  Anesthesia Plan: MAC   Post-op Pain Management:    Induction: Intravenous  Airway Management Planned:   Additional Equipment:   Intra-op Plan:   Post-operative Plan:   Informed Consent: I have reviewed the patients History and Physical, chart, labs and discussed the procedure including the risks, benefits and alternatives for the proposed anesthesia with the patient or authorized representative who has indicated his/her understanding and acceptance.     Plan Discussed with: CRNA and Surgeon  Anesthesia Plan Comments:         Anesthesia Quick Evaluation

## 2012-06-11 NOTE — Progress Notes (Signed)
1145 Called to room to assist preparing pt for OR procedure , Dr Gaynell Face at [facility]. PT alert and talking with labor RN, Paul Dykes at her side evaluating fundus and bleeding. Pt c/o feeling lightheaded. Pt had HOB flat. Pt has husband at [place] comforting her. BP evaluated and shown to DR Gaynell Face as he talks to OR for procedure time. 1148 Methergine given IM while orders being received from Dr Lilli Light and Dr Arby Barrette updated via phone. Dr Arby Barrette also updated on last 2 BP's. 1149 Dr Gaynell Face called OR and 2nd team called in. Dr Arby Barrette notified of new BP. Charge RN notified of pt status. PT diaphoretic but awake and talking and new orders being received.Dr Gaynell Face continues at [facility]. Oxygen 10L via NRMask. 1150 2nd IV NS started by TFeir RNC-OB in Right arm/wrist, pt tolerated well. PP hemmorhage code called. Consulting civil engineer at [employer]. House coverage notified. 1151 Consent signe, dr Arby Barrette, PPhemm team, lab at [facility], foley placed. 1151 pt to OR via bed.

## 2012-06-11 NOTE — Op Note (Signed)
preop diagnosis postpartum hemorrhage  Post op dx   Retained  Tissue Anesthesia MAC Surgeon Dr. Francoise Ceo Patient in lithotomy position bladder emptied with a Foley catheter a weighted speculum placed in the vagina and the vaginal wall examined up to the cervix there were no lacerations noted the anterior lip of the cervix was grasped with a sponge forcep and a large curette was used to curettage the endometrial cavity posteriorly some placental tissue was obtained and sent to pathology the bleeding became much less but it was decided to put  A bachri baloon  in the uterine cavity and this was done and 300 cc of normal saline infused in the  balloon there was no more bleeding noted and the patient taken to the recovery room in good condition

## 2012-06-12 LAB — ABO/RH: ABO/RH(D): A NEG

## 2012-06-12 LAB — KLEIHAUER-BETKE STAIN: # Vials RhIg: 1

## 2012-06-12 MED ORDER — RHO D IMMUNE GLOBULIN 1500 UNIT/2ML IJ SOLN
300.0000 ug | Freq: Once | INTRAMUSCULAR | Status: DC
  Administered 2012-06-12: 300 ug via INTRAMUSCULAR
  Filled 2012-06-12: qty 2

## 2012-06-12 MED ORDER — FERROUS SULFATE 325 (65 FE) MG PO TABS
325.0000 mg | ORAL_TABLET | Freq: Two times a day (BID) | ORAL | Status: DC
  Administered 2012-06-12 – 2012-06-13 (×2): 325 mg via ORAL
  Filled 2012-06-12 (×2): qty 1

## 2012-06-12 NOTE — Progress Notes (Signed)
Patient ID: [name], female   DOB: Aug 12, 1985, 27 y.o.   MRN: [id number] Postpartum day one Vital signs normal Hemoglobin 6.7 orthostatics negative  bakri  balloon removed and Foley removed Legs negative lochia minimal doing well

## 2012-06-12 NOTE — Progress Notes (Signed)
06/12/12 0816 06/12/12 0818 06/12/12 0821  Vitals  BP 130/66 mmHg 131/69 mmHg 139/68 mmHg  MAP (mmHg) 80 84 87  BP Location Right arm Right arm Right arm  BP Method Automatic Automatic Automatic  Patient Position, if appropriate Lying Sitting Standing  Pulse Rate 75 64 99  Orthostatic VS's done. Pt without complaints .

## 2012-06-12 NOTE — Plan of Care (Signed)
Problem: Phase I Progression Outcomes Goal: Voiding adequately Outcome: Completed/Met Date Met:  06/12/12 Voiding large amts since foley out Goal: OOB as tolerated unless otherwise ordered Outcome: Completed/Met Date Met:  06/12/12 Tolerates walking to NICU well Goal: Initial discharge plan identified Outcome: Completed/Met Date Met:  06/12/12 VSS Pain controlled Understands self care Understands when to call MD Understands F/U care Goal: Other Phase I Outcomes/Goals Outcome: Completed/Met Date Met:  06/12/12 Able to void after Foley D/C  Problem: Phase II Progression Outcomes Goal: Pain controlled on oral analgesia Outcome: Completed/Met Date Met:  06/12/12 Good pain control on Motrin and Percocet Goal: Rh isoimmunization per orders Outcome: Completed/Met Date Met:  06/12/12 Rhophylac given

## 2012-06-12 NOTE — Progress Notes (Signed)
CSW attempted to see MOB for NICU admission.  CSW will attempt again tomorrow.  [phone] 

## 2012-06-13 ENCOUNTER — Encounter (HOSPITAL_COMMUNITY): Payer: Self-pay | Admitting: Obstetrics

## 2012-06-13 ENCOUNTER — Ambulatory Visit (INDEPENDENT_AMBULATORY_CARE_PROVIDER_SITE_OTHER): Payer: Medicaid Other | Admitting: Obstetrics

## 2012-06-13 DIAGNOSIS — Z3480 Encounter for supervision of other normal pregnancy, unspecified trimester: Secondary | ICD-10-CM

## 2012-06-13 LAB — TYPE AND SCREEN
ABO/RH(D): A NEG
Unit division: 0
Unit division: 0

## 2012-06-13 LAB — RH IG WORKUP (INCLUDES ABO/RH)
ABO/RH(D): A NEG
Fetal Screen: NEGATIVE
Gestational Age(Wks): 38.3

## 2012-06-13 MED ORDER — IBUPROFEN 600 MG PO TABS: 600.0000 mg | ORAL_TABLET | Freq: Four times a day (QID) | ORAL | Status: DC | PRN

## 2012-06-13 MED ORDER — FUSION PLUS PO CAPS: 1.0000 | ORAL_CAPSULE | Freq: Every day | ORAL | Status: DC

## 2012-06-13 MED ORDER — OXYCODONE-ACETAMINOPHEN 5-325 MG PO TABS: 1.0000 | ORAL_TABLET | ORAL | Status: DC | PRN

## 2012-06-13 NOTE — Progress Notes (Signed)
Patient discharged via ambulation.

## 2012-06-13 NOTE — Progress Notes (Signed)
Post Partum Day 2   Subjective: no complaints  Objective: Blood pressure 114/73, pulse 65, temperature 97.9 F (36.6 C), temperature source Oral, resp. rate 16, height 5\' 8"  (1.727 m), weight 144 lb (65.318 kg), last menstrual period 09/16/2011, SpO2 100.00%, unknown if currently breastfeeding.  Physical Exam:  General: alert and no distress Lochia: appropriate Uterine Fundus: firm Incision: none DVT Evaluation: No evidence of DVT seen on physical exam.   Recent Labs  06/11/12 1209 06/12/12 0600  HGB 10.4* 6.7*  HCT 30.8* 19.5*    Assessment/Plan: Discharge home   LOS: 2 days   HARPER,CHARLES A 06/13/2012, 8:32 AM

## 2012-06-13 NOTE — Progress Notes (Signed)
Ur chart review completed.  

## 2012-06-13 NOTE — Discharge Summary (Signed)
Obstetric Discharge Summary Reason for Admission: onset of labor Prenatal Procedures: ultrasound Intrapartum Procedures: spontaneous vaginal delivery Postpartum Procedures: curettage Complications-Operative and Postpartum: Retained POC.  Curettage done.  Post op anemia.  Stable. Hemoglobin  Date Value Range Status  06/12/2012 6.7* 12.0 - 15.0 g/dL Final     REPEATED TO VERIFY     CRITICAL RESULT CALLED TO, READ BACK BY AND VERIFIED WITH:     SPOKE TO WOODD @ 1610 ON [id number] BY BOSTONC     HCT  Date Value Range Status  06/12/2012 19.5* 36.0 - 46.0 % Final    Physical Exam:  General: alert and no distress Lochia: appropriate Uterine Fundus: firm Incision: none DVT Evaluation: {Exam; [id number]  Discharge Diagnoses: Term Pregnancy-delivered  Discharge Information: Date: 06/13/2012 Activity: pelvic rest Diet: routine Medications: PNV, Ibuprofen, Colace, Iron and Percocet Condition: stable Instructions: refer to practice specific booklet Discharge to: home Follow-up Information   Follow up with Davi Kroon A, MD. Schedule an appointment as soon as possible for a visit in 6 weeks.   Contact information:   [address] [place] [address] [phone]       Newborn Data: Live born female  Birth Weight: 6 lb 2.6 oz (2795 g) APGAR: 4, 7  Home with mother.  [name] A 06/13/2012, 8:40 AM

## 2012-06-15 LAB — CBC
Hemoglobin: 6.7 g/dL — CL (ref 12.0–15.0)
Platelets: 137 10*3/uL — ABNORMAL LOW (ref 150–400)
RBC: 2.18 MIL/uL — ABNORMAL LOW (ref 3.87–5.11)
WBC: 15.6 10*3/uL — ABNORMAL HIGH (ref 4.0–10.5)

## 2012-08-01 ENCOUNTER — Ambulatory Visit: Payer: Medicaid Other | Admitting: Obstetrics

## 2012-08-22 ENCOUNTER — Ambulatory Visit (INDEPENDENT_AMBULATORY_CARE_PROVIDER_SITE_OTHER): Payer: Medicaid Other | Admitting: Obstetrics

## 2012-08-22 ENCOUNTER — Encounter: Payer: Self-pay | Admitting: Obstetrics

## 2012-08-22 DIAGNOSIS — Z3009 Encounter for other general counseling and advice on contraception: Secondary | ICD-10-CM

## 2012-08-22 DIAGNOSIS — D649 Anemia, unspecified: Secondary | ICD-10-CM

## 2012-08-22 DIAGNOSIS — G43909 Migraine, unspecified, not intractable, without status migrainosus: Secondary | ICD-10-CM | POA: Insufficient documentation

## 2012-08-22 LAB — CBC
HCT: 35.5 % — ABNORMAL LOW (ref 36.0–46.0)
Hemoglobin: 11.2 g/dL — ABNORMAL LOW (ref 12.0–15.0)
MCHC: 31.5 g/dL (ref 30.0–36.0)
MCV: 82.6 fL (ref 78.0–100.0)
RDW: 15.4 % (ref 11.5–15.5)

## 2012-08-22 MED ORDER — MEDROXYPROGESTERONE ACETATE 150 MG/ML IM SUSP: 150.0000 mg | INTRAMUSCULAR | Status: DC

## 2012-08-22 NOTE — Progress Notes (Signed)

## 2012-08-22 NOTE — Progress Notes (Signed)
Subjective:     [name] is a 27 y.o. female who presents for a postpartum visit. She is 6 weeks postpartum following a spontaneous vaginal delivery. I have fully reviewed the prenatal and intrapartum course. The delivery was at 38 gestational weeks. Outcome: spontaneous vaginal delivery. Anesthesia: none. Postpartum course has been normal- patient had bleeding after surgery. Baby's course has been normal- was in the hospital 10 days after delivery. Baby is feeding by bottle - Gerber Gentle. Bleeding no bleeding. Bowel function is normal. Bladder function is normal. Patient is sexually active. Contraception method is condoms. Postpartum depression screening: negative.  The following portions of the patient's history were reviewed and updated as appropriate: allergies, current medications, past family history, past medical history, past social history, past surgical history and problem list.  Review of Systems Pertinent items are noted in HPI.   Objective:    BP 134/72  Pulse 73  Temp(Src) 99.5 F (37.5 C)  Ht 5\' 8"  (1.727 m)  Wt 126 lb (57.153 kg)  BMI 19.16 kg/m2  Breastfeeding? No  General:  alert and no distress   Breasts:  inspection negative, no nipple discharge or bleeding, no masses or nodularity palpable  Lungs: not done  Heart:  not done  Abdomen: normal findings: soft, non-tender   Vulva:  normal  Vagina: normal vagina, no discharge, exudate, lesion, or erythema  Cervix:  no lesions  Corpus: normal size, contour, position, consistency, mobility, non-tender  Adnexa:  no mass, fullness, tenderness  Rectal Exam: Not performed.        Assessment:     Normal postpartum exam. Pap smear not done at today's visit.    Wants Depo Provera for contraception.  Plan:    1. Contraception: condoms 2. Depo Provera Rx 3. Follow up in: several months or as needed.

## 2012-08-22 NOTE — Addendum Note (Signed)
Addended by: Glendell Docker on: 08/22/2012 02:58 PM   Modules accepted: Orders

## 2012-08-29 ENCOUNTER — Ambulatory Visit: Payer: Medicaid Other

## 2012-09-05 ENCOUNTER — Ambulatory Visit: Payer: Medicaid Other

## 2013-03-27 ENCOUNTER — Emergency Department (HOSPITAL_COMMUNITY)
Admission: EM | Admit: 2013-03-27 | Discharge: 2013-03-27 | Disposition: A | Discharge status: Home / Self Care | Payer: Medicaid Other | Attending: Emergency Medicine | Admitting: Emergency Medicine

## 2013-03-27 ENCOUNTER — Encounter (HOSPITAL_COMMUNITY): Payer: Self-pay | Admitting: Emergency Medicine

## 2013-03-27 DIAGNOSIS — R5383 Other fatigue: Secondary | ICD-10-CM

## 2013-03-27 DIAGNOSIS — R6889 Other general symptoms and signs: Secondary | ICD-10-CM

## 2013-03-27 DIAGNOSIS — Z79899 Other long term (current) drug therapy: Secondary | ICD-10-CM | POA: Insufficient documentation

## 2013-03-27 DIAGNOSIS — J069 Acute upper respiratory infection, unspecified: Secondary | ICD-10-CM

## 2013-03-27 DIAGNOSIS — M2550 Pain in unspecified joint: Secondary | ICD-10-CM | POA: Insufficient documentation

## 2013-03-27 DIAGNOSIS — R5381 Other malaise: Secondary | ICD-10-CM | POA: Insufficient documentation

## 2013-03-27 DIAGNOSIS — R000 Tachycardia, unspecified: Secondary | ICD-10-CM | POA: Insufficient documentation

## 2013-03-27 DIAGNOSIS — J111 Influenza due to unidentified influenza virus with other respiratory manifestations: Secondary | ICD-10-CM | POA: Insufficient documentation

## 2013-03-27 DIAGNOSIS — F17200 Nicotine dependence, unspecified, uncomplicated: Secondary | ICD-10-CM | POA: Insufficient documentation

## 2013-03-27 DIAGNOSIS — H9209 Otalgia, unspecified ear: Secondary | ICD-10-CM | POA: Insufficient documentation

## 2013-03-27 DIAGNOSIS — R52 Pain, unspecified: Secondary | ICD-10-CM | POA: Insufficient documentation

## 2013-03-27 DIAGNOSIS — R110 Nausea: Secondary | ICD-10-CM | POA: Insufficient documentation

## 2013-03-27 LAB — RAPID STREP SCREEN (MED CTR MEBANE ONLY): STREPTOCOCCUS, GROUP A SCREEN (DIRECT): NEGATIVE

## 2013-03-27 MED ORDER — ACETAMINOPHEN 325 MG PO TABS
650.0000 mg | ORAL_TABLET | Freq: Once | ORAL | Status: AC
  Administered 2013-03-27: 650 mg via ORAL
  Filled 2013-03-27: qty 2

## 2013-03-27 MED ORDER — HYDROCOD POLST-CHLORPHEN POLST 10-8 MG/5ML PO LQCR: 5.0000 mL | Freq: Two times a day (BID) | ORAL | Status: DC | PRN

## 2013-03-27 NOTE — Discharge Instructions (Signed)
Rest, stay well-hydrated. Continue taking over-the-counter medications. Use Tussionex as directed for severe cough, no driving or operating heavy machinery while taking this drug as it may cause drowsiness.  Influenza, Adult Influenza ("the flu") is a viral infection of the respiratory tract. It occurs more often in winter months because people spend more time in close contact with one another. Influenza can make you feel very sick. Influenza easily spreads from person to person (contagious). CAUSES  Influenza is caused by a virus that infects the respiratory tract. You can catch the virus by breathing in droplets from an infected person's cough or sneeze. You can also catch the virus by touching something that was recently contaminated with the virus and then touching your mouth, nose, or eyes. SYMPTOMS  Symptoms typically last 4 to 10 days and may include:  Fever.  Chills.  Headache, body aches, and muscle aches.  Sore throat.  Chest discomfort and cough.  Poor appetite.  Weakness or feeling tired.  Dizziness.  Nausea or vomiting. DIAGNOSIS  Diagnosis of influenza is often made based on your history and a physical exam. A nose or throat swab test can be done to confirm the diagnosis. RISKS AND COMPLICATIONS You may be at risk for a more severe case of influenza if you smoke cigarettes, have diabetes, have chronic heart disease (such as heart failure) or lung disease (such as asthma), or if you have a weakened immune system. Elderly people and pregnant women are also at risk for more serious infections. The most common complication of influenza is a lung infection (pneumonia). Sometimes, this complication can require emergency medical care and may be life-threatening. PREVENTION  An annual influenza vaccination (flu shot) is the best way to avoid getting influenza. An annual flu shot is now routinely recommended for all adults in the [place] TREATMENT  In mild cases, influenza goes  away on its own. Treatment is directed at relieving symptoms. For more severe cases, your caregiver may prescribe antiviral medicines to shorten the sickness. Antibiotic medicines are not effective, because the infection is caused by a virus, not by bacteria. HOME CARE INSTRUCTIONS  Only take over-the-counter or prescription medicines for pain, discomfort, or fever as directed by your caregiver.  Use a cool mist humidifier to make breathing easier.  Get plenty of rest until your temperature returns to normal. This usually takes 3 to 4 days.  Drink enough fluids to keep your urine clear or pale yellow.  Cover your mouth and nose when coughing or sneezing, and wash your hands well to avoid spreading the virus.  Stay home from work or school until your fever has been gone for at least 1 full day. SEEK MEDICAL CARE IF:   You have chest pain or a deep cough that worsens or produces more mucus.  You have nausea, vomiting, or diarrhea. SEEK IMMEDIATE MEDICAL CARE IF:   You have difficulty breathing, shortness of breath, or your skin or nails turn bluish.  You have severe neck pain or stiffness.  You have a severe headache, facial pain, or earache.  You have a worsening or recurring fever.  You have nausea or vomiting that cannot be controlled. MAKE SURE YOU:  Understand these instructions.  Will watch your condition.  Will get help right away if you are not doing well or get worse. Document Released: 02/28/2000 Document Revised: 09/01/2011 Document Reviewed: 06/01/2011 [facility] Patient Information 2014 [place], [place].  Upper Respiratory Infection, Adult An upper respiratory infection (URI) is also sometimes known as  the common cold. The upper respiratory tract includes the nose, sinuses, throat, trachea, and bronchi. Bronchi are the airways leading to the lungs. Most people improve within 1 week, but symptoms can last up to 2 weeks. A residual cough may last even longer.   CAUSES Many different viruses can infect the tissues lining the upper respiratory tract. The tissues become irritated and inflamed and often become very moist. Mucus production is also common. A cold is contagious. You can easily spread the virus to others by oral contact. This includes kissing, sharing a glass, coughing, or sneezing. Touching your mouth or nose and then touching a surface, which is then touched by another person, can also spread the virus. SYMPTOMS  Symptoms typically develop 1 to 3 days after you come in contact with a cold virus. Symptoms vary from person to person. They may include:  Runny nose.  Sneezing.  Nasal congestion.  Sinus irritation.  Sore throat.  Loss of voice (laryngitis).  Cough.  Fatigue.  Muscle aches.  Loss of appetite.  Headache.  Low-grade fever. DIAGNOSIS  You might diagnose your own cold based on familiar symptoms, since most people get a cold 2 to 3 times a year. Your caregiver can confirm this based on your exam. Most importantly, your caregiver can check that your symptoms are not due to another disease such as strep throat, sinusitis, pneumonia, asthma, or epiglottitis. Blood tests, throat tests, and X-rays are not necessary to diagnose a common cold, but they may sometimes be helpful in excluding other more serious diseases. Your caregiver will decide if any further tests are required. RISKS AND COMPLICATIONS  You may be at risk for a more severe case of the common cold if you smoke cigarettes, have chronic heart disease (such as heart failure) or lung disease (such as asthma), or if you have a weakened immune system. The very young and very old are also at risk for more serious infections. Bacterial sinusitis, middle ear infections, and bacterial pneumonia can complicate the common cold. The common cold can worsen asthma and chronic obstructive pulmonary disease (COPD). Sometimes, these complications can require emergency medical care  and may be life-threatening. PREVENTION  The best way to protect against getting a cold is to practice good hygiene. Avoid oral or hand contact with people with cold symptoms. Wash your hands often if contact occurs. There is no clear evidence that vitamin C, vitamin E, echinacea, or exercise reduces the chance of developing a cold. However, it is always recommended to get plenty of rest and practice good nutrition. TREATMENT  Treatment is directed at relieving symptoms. There is no cure. Antibiotics are not effective, because the infection is caused by a virus, not by bacteria. Treatment may include:  Increased fluid intake. Sports drinks offer valuable electrolytes, sugars, and fluids.  Breathing heated mist or steam (vaporizer or shower).  Eating chicken soup or other clear broths, and maintaining good nutrition.  Getting plenty of rest.  Using gargles or lozenges for comfort.  Controlling fevers with ibuprofen or acetaminophen as directed by your caregiver.  Increasing usage of your inhaler if you have asthma. Zinc gel and zinc lozenges, taken in the first 24 hours of the common cold, can shorten the duration and lessen the severity of symptoms. Pain medicines may help with fever, muscle aches, and throat pain. A variety of non-prescription medicines are available to treat congestion and runny nose. Your caregiver can make recommendations and may suggest nasal or lung inhalers for other symptoms.  HOME CARE INSTRUCTIONS   Only take over-the-counter or prescription medicines for pain, discomfort, or fever as directed by your caregiver.  Use a warm mist humidifier or inhale steam from a shower to increase air moisture. This may keep secretions moist and make it easier to breathe.  Drink enough water and fluids to keep your urine clear or pale yellow.  Rest as needed.  Return to work when your temperature has returned to normal or as your caregiver advises. You may need to stay home  longer to avoid infecting others. You can also use a face mask and careful hand washing to prevent spread of the virus. SEEK MEDICAL CARE IF:   After the first few days, you feel you are getting worse rather than better.  You need your caregiver's advice about medicines to control symptoms.  You develop chills, worsening shortness of breath, or brown or red sputum. These may be signs of pneumonia.  You develop yellow or brown nasal discharge or pain in the face, especially when you bend forward. These may be signs of sinusitis.  You develop a fever, swollen neck glands, pain with swallowing, or white areas in the back of your throat. These may be signs of strep throat. SEEK IMMEDIATE MEDICAL CARE IF:   You have a fever.  You develop severe or persistent headache, ear pain, sinus pain, or chest pain.  You develop wheezing, a prolonged cough, cough up blood, or have a change in your usual mucus (if you have chronic lung disease).  You develop sore muscles or a stiff neck. Document Released: 08/26/2000 Document Revised: 05/25/2011 Document Reviewed: 07/04/2010 [facility] Patient Information 2014 [place], [place].

## 2013-03-27 NOTE — ED Notes (Signed)
Pt is here with 2 days of sore throat, body aches, gagging, and headache

## 2013-03-27 NOTE — ED Provider Notes (Signed)
CSN: [id number]     Arrival date & time 03/27/13  [id number] History   First MD Initiated Contact with Patient 03/27/13 [phone]     Chief Complaint  Patient presents with  . Sore Throat  . Fever  . Generalized Body Aches   (Consider location/radiation/quality/duration/timing/severity/associated sxs/prior Treatment) HPI Comments: Patient is a 28 year old female who presents to the emergency department complaining of sore throat, subjective fever, body aches, earache and headache x2 days. States she has had a decreased appetite and has been tired. Admits to associated nonproductive cough but feels as if there is mucus in the back of her throat. She did not have the flu vaccine this year. She has tried taking ibuprofen and Alka-Seltzer with minimal relief. Denies vomiting, change in urine output or bowel habits, sob, wheezing.  Patient is a 28 y.o. female presenting with pharyngitis and fever. The history is provided by the patient.  Sore Throat Associated symptoms include arthralgias, congestion, coughing, fatigue, a fever, myalgias, nausea and a sore throat.  Fever Associated symptoms: congestion, cough, ear pain, myalgias, nausea and sore throat     Past Medical History  Diagnosis Date  . Headache(784.0)   . Abnormal Pap smear     HPV   Past Surgical History  Procedure Laterality Date  . No past surgeries    . Dilation and evacuation N/A 06/11/2012    Procedure: DILATATION AND EVACUATION;  Surgeon: Kathreen CosierBernard A Marshall, MD;  Location: [facility];  Service: Gynecology;  Laterality: N/A;  with insertion Bakri Balloon  - Emergency   Family History  Problem Relation Age of Onset  . Hypertension Father   . Hearing loss Neg Hx    History  Substance Use Topics  . Smoking status: Current Every Day Smoker -- 0.25 packs/day for 7 years  . Smokeless tobacco: Never Used  . Alcohol Use: No   OB History   Grav Para Term Preterm Abortions TAB SAB Ect Mult Living   3 3 3  0 0 0 0 0 0 3     Review of  Systems  Constitutional: Positive for fever and fatigue.  HENT: Positive for congestion, ear pain and sore throat.   Respiratory: Positive for cough.   Gastrointestinal: Positive for nausea.  Musculoskeletal: Positive for arthralgias and myalgias.  All other systems reviewed and are negative.    Allergies  Review of patient's allergies indicates no known allergies.  Home Medications   Current Outpatient Rx  Name  Route  Sig  Dispense  Refill  . ibuprofen (ADVIL,MOTRIN) 600 MG tablet   Oral   Take 600 mg by mouth every 6 (six) hours as needed (pain).         . Iron-FA-B Cmp-C-Biot-Probiotic (FUSION PLUS) CAPS   Oral   Take 1 capsule by mouth daily before breakfast.   30 capsule   5   . Prenatal Vit-Fe Fumarate-FA (MULTIVITAMIN-PRENATAL) 27-0.8 MG TABS   Oral   Take 1 tablet by mouth daily at 12 noon.         . chlorpheniramine-HYDROcodone (TUSSIONEX PENNKINETIC ER) 10-8 MG/5ML LQCR   Oral   Take 5 mLs by mouth every 12 (twelve) hours as needed for cough.   115 mL   0    BP 136/70  Pulse 125  Temp(Src) 102.5 F (39.2 C) (Oral)  Resp 18  Wt 123 lb 5 oz (55.934 kg)  SpO2 97%  LMP 02/27/2013 Physical Exam  Nursing note and vitals reviewed. Constitutional: She is oriented to person, place,  and time. She appears well-developed and well-nourished. No distress.  HENT:  Head: Normocephalic and atraumatic.  Right Ear: Tympanic membrane and ear canal normal.  Left Ear: Tympanic membrane and ear canal normal.  Nose: Mucosal edema and rhinorrhea present.  Mouth/Throat: Uvula is midline. Posterior oropharyngeal erythema present. No oropharyngeal exudate or posterior oropharyngeal edema.  Post nasal drip present.  Eyes: Conjunctivae are normal.  Neck: Normal range of motion. Neck supple.  Cardiovascular: Regular rhythm and normal heart sounds.  Tachycardia present.   Pulmonary/Chest: Effort normal and breath sounds normal.  Abdominal: Soft. Bowel sounds are normal.  There is no tenderness.  Musculoskeletal: Normal range of motion. She exhibits no edema.  Lymphadenopathy:    She has cervical adenopathy.  Neurological: She is alert and oriented to person, place, and time.  Skin: Skin is warm and dry. She is not diaphoretic.  Psychiatric: She has a normal mood and affect. Her behavior is normal.    ED Course  Procedures (including critical care time) Labs Review Labs Reviewed  RAPID STREP SCREEN   Imaging Review No results found.  EKG Interpretation   None       MDM   1. Flu-like symptoms   2. URI (upper respiratory infection)    Patient is well appearing and in no apparent distress, febrile at 102.5, tachycardic around 120 on my exam. Tylenol given. Symptoms flu-like, discussed symptomatic tx. She is stable for discharge. Return precautions given. Patient states understanding of treatment care plan and is agreeable.    Trevor Mace, PA-C 03/27/13 [phone]

## 2013-03-29 LAB — CULTURE, GROUP A STREP

## 2013-03-30 NOTE — ED Provider Notes (Signed)
Medical screening examination/treatment/procedure(s) were performed by non-physician practitioner and as supervising physician I was immediately available for consultation/collaboration.  EKG Interpretation   None        Dotti Busey R. Kely Dohn, MD 03/30/13 1601 

## 2013-08-09 ENCOUNTER — Emergency Department (HOSPITAL_COMMUNITY)
Admission: EM | Admit: 2013-08-09 | Discharge: 2013-08-09 | Disposition: A | Discharge status: Home / Self Care | Payer: Medicaid Other | Attending: Emergency Medicine | Admitting: Emergency Medicine

## 2013-08-09 ENCOUNTER — Encounter (HOSPITAL_COMMUNITY): Payer: Self-pay | Admitting: Emergency Medicine

## 2013-08-09 ENCOUNTER — Emergency Department (HOSPITAL_COMMUNITY): Payer: Medicaid Other

## 2013-08-09 DIAGNOSIS — Y9389 Activity, other specified: Secondary | ICD-10-CM | POA: Insufficient documentation

## 2013-08-09 DIAGNOSIS — S9030XA Contusion of unspecified foot, initial encounter: Secondary | ICD-10-CM | POA: Insufficient documentation

## 2013-08-09 DIAGNOSIS — Y92009 Unspecified place in unspecified non-institutional (private) residence as the place of occurrence of the external cause: Secondary | ICD-10-CM | POA: Insufficient documentation

## 2013-08-09 DIAGNOSIS — Z79899 Other long term (current) drug therapy: Secondary | ICD-10-CM | POA: Insufficient documentation

## 2013-08-09 DIAGNOSIS — F17200 Nicotine dependence, unspecified, uncomplicated: Secondary | ICD-10-CM | POA: Insufficient documentation

## 2013-08-09 DIAGNOSIS — S9031XA Contusion of right foot, initial encounter: Secondary | ICD-10-CM

## 2013-08-09 DIAGNOSIS — Z791 Long term (current) use of non-steroidal anti-inflammatories (NSAID): Secondary | ICD-10-CM | POA: Insufficient documentation

## 2013-08-09 DIAGNOSIS — IMO0002 Reserved for concepts with insufficient information to code with codable children: Secondary | ICD-10-CM | POA: Insufficient documentation

## 2013-08-09 MED ORDER — TRAMADOL HCL 50 MG PO TABS: 50.0000 mg | ORAL_TABLET | Freq: Four times a day (QID) | ORAL | Status: DC | PRN

## 2013-08-09 MED ORDER — OXYCODONE-ACETAMINOPHEN 5-325 MG PO TABS
1.0000 | ORAL_TABLET | Freq: Once | ORAL | Status: AC
  Administered 2013-08-09: 1 via ORAL
  Filled 2013-08-09: qty 1

## 2013-08-09 NOTE — Discharge Instructions (Signed)
For pain control you may take:  800mg of ibuprofen (that is usually 4 over the counter pills)  3 times a day (take with food) and acetaminophen 975mg (this is 3 over the counter pills) four times a day. Do not drink alcohol or combine with other medications that have acetaminophen as an ingredient (Read the labels!).  For breakthrough pain you may take Tramadol. Do not drink alcohol drive or operate heavy machinery when taking Tramadol. ° °Do not hesitate to return to the Emergency Department for any new, worsening or concerning symptoms.  ° °If you do not have a primary care doctor you can establish one at the  ° °[facility]: °[address] °[place] [address] °[phone] ° °After you establish care. Let them know you were seen in the emergency room. They must obtain records for further management.  ° ° °Foot Contusion °A foot contusion is a deep bruise to the foot. Contusions are the result of an injury that caused bleeding under the skin. The contusion may turn blue, purple, or yellow. Minor injuries will give you a painless contusion, but more severe contusions may stay painful and swollen for a few weeks. °CAUSES  °A foot contusion comes from a direct blow to that area, such as a heavy object falling on the foot. °SYMPTOMS  °· Swelling of the foot. °· Discoloration of the foot. °· Tenderness or soreness of the foot. °DIAGNOSIS  °You will have a physical exam and will be asked about your history. You may need an X-ray of your foot to look for a broken bone (fracture).  °TREATMENT  °An elastic wrap may be recommended to support your foot. Resting, elevating, and applying cold compresses to your foot are often the best treatments for a foot contusion. Over-the-counter medicines may also be recommended for pain control. °HOME CARE INSTRUCTIONS  °· Put ice on the injured area. °· Put ice in a plastic bag. °· Place a towel between your skin and the bag. °· Leave the ice on for 15-20 minutes,  03-04 times a day. °· Only take over-the-counter or prescription medicines for pain, discomfort, or fever as directed by your caregiver. °· If told, use an elastic wrap as directed. This can help reduce swelling. You may remove the wrap for sleeping, showering, and bathing. If your toes become numb, cold, or blue, take the wrap off and reapply it more loosely. °· Elevate your foot with pillows to reduce swelling. °· Try to avoid standing or walking while the foot is painful. Do not resume use until instructed by your caregiver. Then, begin use gradually. If pain develops, decrease use. Gradually increase activities that do not cause discomfort until you have normal use of your foot. °· See your caregiver as directed. It is very important to keep all follow-up appointments in order to avoid any lasting problems with your foot, including long-term (chronic) pain. °SEEK IMMEDIATE MEDICAL CARE IF:  °· You have increased redness, swelling, or pain in your foot. °· Your swelling or pain is not relieved with medicines. °· You have loss of feeling in your foot or are unable to move your toes. °· Your foot turns cold or blue. °· You have pain when you move your toes. °· Your foot becomes warm to the touch. °· Your contusion does not improve in 2 days. °MAKE SURE YOU:  °· Understand these instructions. °· Will watch your condition. °· Will get help right away if you are not doing well or   get worse. °Document Released: 12/22/2005 Document Revised: 09/01/2011 Document Reviewed: 02/03/2011 °[employer]® Patient Information ©2014 [employer]. ° °
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
## 2013-08-11 MED ORDER — TRAMADOL HCL 50 MG PO TABS: 50.0000 mg | ORAL_TABLET | Freq: Four times a day (QID) | ORAL | Status: DC | PRN

## 2013-08-11 NOTE — ED Provider Notes (Signed)
Pharmacy would not except initial prescription from the emergency department for uncertain reason so patient's initial prescription was shredded and a new prescription replaced for the 15 tablets of Ultram prescribed during her recent ED visit. [id number]  Hurman Horn, MD 08/12/13 2247

## 2013-08-12 NOTE — ED Provider Notes (Signed)
Medical screening examination/treatment/procedure(s) were performed by non-physician practitioner and as supervising physician I was immediately available for consultation/collaboration.   EKG Interpretation None        Amra Shukla N Deisi Salonga, DO 08/12/13 0707 

## 2013-08-28 ENCOUNTER — Other Ambulatory Visit: Payer: Medicaid Other

## 2013-08-29 ENCOUNTER — Encounter (HOSPITAL_COMMUNITY): Payer: Self-pay | Admitting: *Deleted

## 2013-08-29 ENCOUNTER — Inpatient Hospital Stay (HOSPITAL_COMMUNITY)
Admission: AD | Admit: 2013-08-29 | Discharge: 2013-08-29 | Disposition: A | Discharge status: Home / Self Care | Payer: Medicaid Other | Source: Ambulatory Visit | Attending: Obstetrics & Gynecology | Admitting: Obstetrics & Gynecology

## 2013-08-29 DIAGNOSIS — N760 Acute vaginitis: Secondary | ICD-10-CM | POA: Insufficient documentation

## 2013-08-29 DIAGNOSIS — A499 Bacterial infection, unspecified: Secondary | ICD-10-CM

## 2013-08-29 DIAGNOSIS — O99330 Smoking (tobacco) complicating pregnancy, unspecified trimester: Secondary | ICD-10-CM | POA: Insufficient documentation

## 2013-08-29 DIAGNOSIS — Z87891 Personal history of nicotine dependence: Secondary | ICD-10-CM | POA: Insufficient documentation

## 2013-08-29 DIAGNOSIS — B9689 Other specified bacterial agents as the cause of diseases classified elsewhere: Secondary | ICD-10-CM | POA: Insufficient documentation

## 2013-08-29 DIAGNOSIS — O2390 Unspecified genitourinary tract infection in pregnancy, unspecified trimester: Secondary | ICD-10-CM | POA: Insufficient documentation

## 2013-08-29 HISTORY — DX: Anemia, unspecified: D64.9

## 2013-08-29 LAB — URINALYSIS, ROUTINE W REFLEX MICROSCOPIC
BILIRUBIN URINE: NEGATIVE
Glucose, UA: NEGATIVE mg/dL
HGB URINE DIPSTICK: NEGATIVE
Ketones, ur: NEGATIVE mg/dL
Nitrite: NEGATIVE
Protein, ur: NEGATIVE mg/dL
SPECIFIC GRAVITY, URINE: 1.02 (ref 1.005–1.030)
UROBILINOGEN UA: 0.2 mg/dL (ref 0.0–1.0)
pH: 7 (ref 5.0–8.0)

## 2013-08-29 LAB — WET PREP, GENITAL
TRICH WET PREP: NONE SEEN
YEAST WET PREP: NONE SEEN

## 2013-08-29 LAB — URINE MICROSCOPIC-ADD ON

## 2013-08-29 LAB — POCT PREGNANCY, URINE: Preg Test, Ur: POSITIVE — AB

## 2013-08-29 MED ORDER — METRONIDAZOLE 500 MG PO TABS: 500.0000 mg | ORAL_TABLET | Freq: Two times a day (BID) | ORAL | Status: DC

## 2013-08-29 NOTE — MAU Provider Note (Signed)
I examined pt and agree with documentation above and PA-S plan of care.  [facility]  Eino FarberWalidah Paul HalfN Muhammad, CNM

## 2013-08-29 NOTE — MAU Note (Signed)
Light yellow vaginal discharge, no odor.  Continuous, has to wear panty liner. + HPT and +UPT at [facility] care center.

## 2013-08-29 NOTE — MAU Provider Note (Signed)
History     CSN: [id number]  Arrival date and time: 08/29/13 [id number]   First [name] Initiated Contact with Patient 08/29/13 [phone]      Chief Complaint  Patient presents with  . Vaginal Discharge   HPI  [name] is a 28 y/o G4P3003 at 219w6d dating by LMP on 07/12/13 with complaints of vaginal discharge that began yesterday morning. Pt states that the discharge is constant throughout the day, appears watery and light yellow, and has been soaking through her panty liners. Pt denies any vaginal bleeding, odor, itching, or pain. She also denies fever, abdominal pain, or UTI symptoms. Pt denies experiencing these symptoms in previous pregnancies.   Past Medical History  Diagnosis Date  . Headache(784.0)   . Abnormal Pap smear     HPV  . Anemia   . Postpartum hemorrhage     Past Surgical History  Procedure Laterality Date  . No past surgeries    . Dilation and evacuation N/A 06/11/2012    Procedure: DILATATION AND EVACUATION;  Surgeon: Kathreen CosierBernard A Marshall, MD;  Location: [facility];  Service: Gynecology;  Laterality: N/A;  with insertion Bakri Balloon  - Emergency    Family History  Problem Relation Age of Onset  . Hypertension Father   . Hearing loss Neg Hx     History  Substance Use Topics  . Smoking status: Current Every Day Smoker -- 0.25 packs/day for 7 years    Types: Cigarettes  . Smokeless tobacco: Never Used  . Alcohol Use: No    Allergies: No Known Allergies  No prescriptions prior to admission    Review of Systems  Constitutional: Negative for fever and chills.  Gastrointestinal: Negative for nausea, vomiting, abdominal pain, diarrhea and constipation.  Genitourinary: Negative for dysuria, urgency, frequency and hematuria.       Negative vaginal bleeding, odor, itching, and pain.   Physical Exam   Blood pressure 117/66, pulse 67, temperature 99 F (37.2 C), temperature source Oral, resp. rate 18, height 5\' 8"  (1.727 m), weight 57.153 kg (126 lb), last  menstrual period 07/12/2013.  Physical Exam  Constitutional: She appears well-developed and well-nourished. No distress.  Cardiovascular: Normal rate, regular rhythm and normal heart sounds.   Respiratory: Effort normal and breath sounds normal.  GI: Soft. Bowel sounds are normal. She exhibits no distension. There is no tenderness.  Genitourinary: There is no rash or lesion on the right labia. There is no rash or lesion on the left labia. Uterus is enlarged (approximate uterus size is 8 weeks). Uterus is not tender. Cervix exhibits no motion tenderness and no discharge. No tenderness or bleeding around the vagina. Vaginal discharge (scant amounts of ligh yellow/white discharge) found.   Results for orders placed during the hospital encounter of 08/29/13 (from the past 24 hour(s))  URINALYSIS, ROUTINE W REFLEX MICROSCOPIC     Status: Abnormal   Collection Time    08/29/13  9:30 AM      Result Value Ref Range   Color, Urine YELLOW  YELLOW   APPearance CLEAR  CLEAR   Specific Gravity, Urine 1.020  1.005 - 1.030   pH 7.0  5.0 - 8.0   Glucose, UA NEGATIVE  NEGATIVE mg/dL   Hgb urine dipstick NEGATIVE  NEGATIVE   Bilirubin Urine NEGATIVE  NEGATIVE   Ketones, ur NEGATIVE  NEGATIVE mg/dL   Protein, ur NEGATIVE  NEGATIVE mg/dL   Urobilinogen, UA 0.2  0.0 - 1.0 mg/dL   Nitrite NEGATIVE  NEGATIVE  Leukocytes, UA SMALL (*) NEGATIVE  URINE MICROSCOPIC-ADD ON     Status: None   Collection Time    08/29/13  9:30 AM      Result Value Ref Range   Squamous Epithelial / LPF RARE  RARE   WBC, UA 3-6  <3 WBC/hpf   RBC / HPF 0-2  <3 RBC/hpf  POCT PREGNANCY, URINE     Status: Abnormal   Collection Time    08/29/13  9:43 AM      Result Value Ref Range   Preg Test, Ur POSITIVE (*) NEGATIVE  WET PREP, GENITAL     Status: Abnormal   Collection Time    08/29/13 10:25 AM      Result Value Ref Range   Yeast Wet Prep HPF POC NONE SEEN  NONE SEEN   Trich, Wet Prep NONE SEEN  NONE SEEN   Clue Cells Wet  Prep HPF POC FEW (*) NONE SEEN   WBC, Wet Prep HPF POC TOO NUMEROUS TO COUNT (*) NONE SEEN    MAU Course  Procedures  MDM Pelvic exam with cultures to include genital wet prep and GC/Chlamydia   Assessment and Plan  Bacterial Vaginosis   Plan: Flagyl 500 mg tablet PO BID x 7 days   Bing PlumeGervasi, Kristin E 08/29/2013, 11:01 AM

## 2013-08-29 NOTE — Discharge Instructions (Signed)
Bacterial Vaginosis Bacterial vaginosis is an infection of the vagina. It happens when too many of certain germs (bacteria) grow in the vagina. HOME CARE  Take your medicine as told by your doctor.  Finish your medicine even if you start to feel better.  Do not have sex until you finish your medicine and are better.  Tell your sex partner that you have an infection. They should see their doctor for treatment.  Practice safe sex. Use condoms. Have only one sex partner. GET HELP IF:  You are not getting better after 3 days of treatment.  You have more grey fluid (discharge) coming from your vagina than before.  You have more pain than before.  You have a fever. MAKE SURE YOU:   Understand these instructions.  Will watch your condition.  Will get help right away if you are not doing well or get worse. Document Released: 12/10/2007 Document Revised: 12/21/2012 Document Reviewed: 10/12/2012 Exit[employer] Patient Information 2014 [employer].  

## 2013-08-30 LAB — GC/CHLAMYDIA PROBE AMP
CT PROBE, AMP APTIMA: NEGATIVE
GC Probe RNA: NEGATIVE

## 2013-09-05 ENCOUNTER — Inpatient Hospital Stay (HOSPITAL_COMMUNITY): Payer: Medicaid Other

## 2013-09-05 ENCOUNTER — Inpatient Hospital Stay (HOSPITAL_COMMUNITY)
Admission: AD | Admit: 2013-09-05 | Discharge: 2013-09-05 | Disposition: A | Discharge status: Home / Self Care | Payer: Medicaid Other | Source: Ambulatory Visit | Attending: Obstetrics & Gynecology | Admitting: Obstetrics & Gynecology

## 2013-09-05 ENCOUNTER — Encounter (HOSPITAL_COMMUNITY): Payer: Self-pay

## 2013-09-05 DIAGNOSIS — O200 Threatened abortion: Secondary | ICD-10-CM | POA: Insufficient documentation

## 2013-09-05 DIAGNOSIS — O4690 Antepartum hemorrhage, unspecified, unspecified trimester: Secondary | ICD-10-CM

## 2013-09-05 DIAGNOSIS — O99330 Smoking (tobacco) complicating pregnancy, unspecified trimester: Secondary | ICD-10-CM | POA: Insufficient documentation

## 2013-09-05 DIAGNOSIS — O209 Hemorrhage in early pregnancy, unspecified: Secondary | ICD-10-CM

## 2013-09-05 LAB — CBC
HEMATOCRIT: 35.3 % — AB (ref 36.0–46.0)
Hemoglobin: 11.8 g/dL — ABNORMAL LOW (ref 12.0–15.0)
MCH: 29.4 pg (ref 26.0–34.0)
MCHC: 33.4 g/dL (ref 30.0–36.0)
MCV: 88 fL (ref 78.0–100.0)
Platelets: 301 10*3/uL (ref 150–400)
RBC: 4.01 MIL/uL (ref 3.87–5.11)
RDW: 13.5 % (ref 11.5–15.5)
WBC: 7.2 10*3/uL (ref 4.0–10.5)

## 2013-09-05 LAB — HCG, QUANTITATIVE, PREGNANCY: hCG, Beta Chain, Quant, S: 1607 m[IU]/mL — ABNORMAL HIGH (ref <5)

## 2013-09-05 MED ORDER — RHO D IMMUNE GLOBULIN 1500 UNIT/2ML IJ SOSY
300.0000 ug | PREFILLED_SYRINGE | Freq: Once | INTRAMUSCULAR | Status: AC
  Administered 2013-09-05: 300 ug via INTRAMUSCULAR
  Filled 2013-09-05: qty 2

## 2013-09-05 NOTE — MAU Note (Signed)
Patient states she passed some type of rubbery object yesterday. States she has been having a little vaginal bleeding for a few days, since she started taking the antibiotic. Denies pain.

## 2013-09-05 NOTE — MAU Provider Note (Signed)
History     CSN: [id number]  Arrival date and time: 09/05/13 [id number]   None     Chief Complaint  Patient presents with  . Vaginal Bleeding   HPI Pt is 6175w6d pregnant and presents with small amount of vaginal bleeding.  Pt was seen here on 08/29/2013 and dx with BV and treated with Flagyl. Pt states the bleeding became heavier after that and has not stopped.  Pt has minimal cramping.  Pt has hx of PP hemorrhage treated with Bakri Balloon. Pt is Rh neg.  RN note: Patient states she passed some type of rubbery object yesterday. States she has been having a little vaginal bleeding for a few days, since she started taking the antibiotic. Denies pain   Past Medical History  Diagnosis Date  . Headache(784.0)   . Abnormal Pap smear     HPV  . Anemia   . Postpartum hemorrhage     Past Surgical History  Procedure Laterality Date  . No past surgeries    . Dilation and evacuation N/A 06/11/2012    Procedure: DILATATION AND EVACUATION;  Surgeon: Kathreen CosierBernard A Marshall, MD;  Location: [facility];  Service: Gynecology;  Laterality: N/A;  with insertion Bakri Balloon  - Emergency    Family History  Problem Relation Age of Onset  . Hypertension Father   . Heart disease Father   . Hearing loss Neg Hx     History  Substance Use Topics  . Smoking status: Current Every Day Smoker -- 0.25 packs/day for 7 years    Types: Cigarettes  . Smokeless tobacco: Never Used  . Alcohol Use: No    Allergies: No Known Allergies  Prescriptions prior to admission  Medication Sig Dispense Refill  . ibuprofen (ADVIL,MOTRIN) 200 MG tablet Take 400 mg by mouth every 6 (six) hours as needed.      . Iron-FA-B Cmp-C-Biot-Probiotic (FUSION PLUS) CAPS Take 1 capsule by mouth daily before breakfast.  30 capsule  5    Review of Systems  Constitutional: Negative for fever and chills.  Gastrointestinal: Negative for nausea, vomiting, abdominal pain, diarrhea and constipation.  Genitourinary: Negative for dysuria.   Musculoskeletal: Negative for back pain.  Neurological: Negative for headaches.   Physical Exam   Blood pressure 128/65, pulse 91, temperature 99 F (37.2 C), temperature source Oral, resp. rate 16, last menstrual period 07/12/2013.  Physical Exam  Nursing note and vitals reviewed. Constitutional: She is oriented to person, place, and time. She appears well-developed and well-nourished. No distress.  HENT:  Head: Normocephalic.  Eyes: Pupils are equal, round, and reactive to light.  Neck: Normal range of motion. Neck supple.  Cardiovascular: Normal rate.   Respiratory: Effort normal.  GI: Soft.  Genitourinary:  sm-mod amount of BRB invault; cervix closed NT; uterus NSSC NT; adnexa without palpable enlargement or tenderness  Musculoskeletal: Normal range of motion.  Neurological: She is alert and oriented to person, place, and time.  Skin: Skin is warm and dry.  Psychiatric: She has a normal mood and affect.    MAU Course  Procedures [place] Ob Comp Less 14 Wks  09/05/2013   CLINICAL DATA:  Pregnant. Vaginal bleeding. Clots/products of conception passed yesterday. Quantitative beta HCG level, 1607  EXAM: OBSTETRIC <14 WK ULTRASOUND: TRANSABDOMINAL AND ENDOVAGINAL IMAGING.  TECHNIQUE: Transabdominal and endovaginal ultrasound was performed for evaluation of the gestation as well as the maternal uterus and adnexal regions.  COMPARISON:  None.  FINDINGS: Intrauterine gestational sac: Not visualized  Yolk sac:  No  Embryo:  No  Maternal uterus/adnexae: No evidence of an intrauterine pregnancy. No endometrial fluid or evidence of retained products of conception. No uterine mass. Ovaries are normal. No free fluid.  IMPRESSION: Completed miscarriage.   Electronically Signed   By: Amie Portlandavid  Ormond M.D.   On: 09/05/2013 09:39   [place] Ob Transvaginal  09/05/2013   CLINICAL DATA:  Pregnant. Vaginal bleeding. Clots/products of conception passed yesterday. Quantitative beta HCG level, 1607  EXAM: OBSTETRIC  <14 WK ULTRASOUND: TRANSABDOMINAL AND ENDOVAGINAL IMAGING.  TECHNIQUE: Transabdominal and endovaginal ultrasound was performed for evaluation of the gestation as well as the maternal uterus and adnexal regions.  COMPARISON:  None.  FINDINGS: Intrauterine gestational sac: Not visualized  Yolk sac:  No  Embryo:  No  Maternal uterus/adnexae: No evidence of an intrauterine pregnancy. No endometrial fluid or evidence of retained products of conception. No uterine mass. Ovaries are normal. No free fluid.  IMPRESSION: Completed miscarriage.   Electronically Signed   By: Amie Portlandavid  Ormond M.D.   On: 09/05/2013 09:39   Results for orders placed during the hospital encounter of 09/05/13 (from the past 24 hour(s))  CBC     Status: Abnormal   Collection Time    09/05/13  7:50 AM      Result Value Ref Range   WBC 7.2  4.0 - 10.5 K/uL   RBC 4.01  3.87 - 5.11 MIL/uL   Hemoglobin 11.8 (*) 12.0 - 15.0 g/dL   HCT 09.835.3 (*) 11.936.0 - 14.746.0 %   MCV 88.0  78.0 - 100.0 fL   MCH 29.4  26.0 - 34.0 pg   MCHC 33.4  30.0 - 36.0 g/dL   RDW 82.913.5  56.211.5 - 13.015.5 %   Platelets 301  150 - 400 K/uL  HCG, QUANTITATIVE, PREGNANCY     Status: Abnormal   Collection Time    09/05/13  7:50 AM      Result Value Ref Range   hCG, Beta Chain, Quant, S 1607 (*) <5 mIU/mL  RH IG WORKUP (INCLUDES ABO/RH)     Status: None   Collection Time    09/05/13  7:50 AM      Result Value Ref Range   Gestational Age(Wks) 7     ABO/RH(D) A NEG     Antibody Screen NEG     Unit Number [id number]     Blood Component Type RHIG     Unit division 00     Status of Unit ALLOCATED     Transfusion Status OK TO TRANSFUSE     [place] Ob Comp Less 14 Wks  09/05/2013   CLINICAL DATA:  Pregnant. Vaginal bleeding. Clots/products of conception passed yesterday. Quantitative beta HCG level, 1607  EXAM: OBSTETRIC <14 WK ULTRASOUND: TRANSABDOMINAL AND ENDOVAGINAL IMAGING.  TECHNIQUE: Transabdominal and endovaginal ultrasound was performed for evaluation of the gestation as  well as the maternal uterus and adnexal regions.  COMPARISON:  None.  FINDINGS: Intrauterine gestational sac: Not visualized  Yolk sac:  No  Embryo:  No  Maternal uterus/adnexae: No evidence of an intrauterine pregnancy. No endometrial fluid or evidence of retained products of conception. No uterine mass. Ovaries are normal. No free fluid.  IMPRESSION: Completed miscarriage.   Electronically Signed   By: Amie Portlandavid  Ormond M.D.   On: 09/05/2013 09:39   [place] Ob Transvaginal  09/05/2013   CLINICAL DATA:  Pregnant. Vaginal bleeding. Clots/products of conception passed yesterday. Quantitative beta HCG level, 1607  EXAM: OBSTETRIC <14 WK ULTRASOUND: TRANSABDOMINAL AND  ENDOVAGINAL IMAGING.  TECHNIQUE: Transabdominal and endovaginal ultrasound was performed for evaluation of the gestation as well as the maternal uterus and adnexal regions.  COMPARISON:  None.  FINDINGS: Intrauterine gestational sac: Not visualized  Yolk sac:  No  Embryo:  No  Maternal uterus/adnexae: No evidence of an intrauterine pregnancy. No endometrial fluid or evidence of retained products of conception. No uterine mass. Ovaries are normal. No free fluid.  IMPRESSION: Completed miscarriage.   Electronically Signed   By: Amie Portland M.D.   On: 09/05/2013 09:39  Rhogam given Assessment and Plan  Bleeding in pregnancy Probable completed miscarriage- f/u in 48 hours for repeat HCG to make sure level is coming down Pt to return sooner if increase in pain or bleeding or further concerns  Avaneesh Pepitone 09/05/2013, 8:12 AM

## 2013-09-05 NOTE — Discharge Instructions (Signed)
Vaginal Bleeding During Pregnancy, First Trimester  A small amount of bleeding (spotting) from the vagina is relatively common in early pregnancy. It usually stops on its own. Various things may cause bleeding or spotting in early pregnancy. Some bleeding may be related to the pregnancy, and some may not. In most cases, the bleeding is normal and is not a problem. However, bleeding can also be a sign of something serious. Be sure to tell your health care provider about any vaginal bleeding right away.  Some possible causes of vaginal bleeding during the first trimester include:  · Infection or inflammation of the cervix.  · Growths (polyps) on the cervix.  · Miscarriage or threatened miscarriage.  · Pregnancy tissue has developed outside of the uterus and in a fallopian tube (tubal pregnancy).  · Tiny cysts have developed in the uterus instead of pregnancy tissue (molar pregnancy).  HOME CARE INSTRUCTIONS   Watch your condition for any changes. The following actions may help to lessen any discomfort you are feeling:  · Follow your health care provider's instructions for limiting your activity. If your health care provider orders bed rest, you may need to stay in bed and only get up to use the bathroom. However, your health care provider may allow you to continue light activity.  · If needed, make plans for someone to help with your regular activities and responsibilities while you are on bed rest.  · Keep track of the number of pads you use each day, how often you change pads, and how soaked (saturated) they are. Write this down.  · Do not use tampons. Do not douche.  · Do not have sexual intercourse or orgasms until approved by your health care provider.  · If you pass any tissue from your vagina, save the tissue so you can show it to your health care provider.  · Only take over-the-counter or prescription medicines as directed by your health care provider.  · Do not take aspirin because it can make you  bleed.  · Keep all follow-up appointments as directed by your health care provider.  SEEK MEDICAL CARE IF:  · You have any vaginal bleeding during any part of your pregnancy.  · You have cramps or labor pains.  · You have a fever, not controlled by medicine.  SEEK IMMEDIATE MEDICAL CARE IF:   · You have severe cramps in your back or belly (abdomen).  · You pass large clots or tissue from your vagina.  · Your bleeding increases.  · You feel light-headed or weak, or you have fainting episodes.  · You have chills.  · You are leaking fluid or have a gush of fluid from your vagina.  · You pass out while having a bowel movement.  MAKE SURE YOU:  · Understand these instructions.  · Will watch your condition.  · Will get help right away if you are not doing well or get worse.  Document Released: 12/10/2004 Document Revised: 03/07/2013 Document Reviewed: 11/07/2012  [employer]® Patient Information ©2015 [employer]. This information is not intended to replace advice given to you by your health care provider. Make sure you discuss any questions you have with your health care provider.

## 2013-09-06 LAB — RH IG WORKUP (INCLUDES ABO/RH)
ABO/RH(D): A NEG
ANTIBODY SCREEN: NEGATIVE
Gestational Age(Wks): 7
UNIT DIVISION: 0

## 2013-09-07 ENCOUNTER — Inpatient Hospital Stay (HOSPITAL_COMMUNITY)
Admission: AD | Admit: 2013-09-07 | Discharge: 2013-09-07 | Disposition: A | Discharge status: Home / Self Care | Payer: Medicaid Other | Source: Ambulatory Visit | Attending: Obstetrics & Gynecology | Admitting: Obstetrics & Gynecology

## 2013-09-07 DIAGNOSIS — Z87891 Personal history of nicotine dependence: Secondary | ICD-10-CM | POA: Insufficient documentation

## 2013-09-07 DIAGNOSIS — O039 Complete or unspecified spontaneous abortion without complication: Secondary | ICD-10-CM | POA: Insufficient documentation

## 2013-09-07 LAB — HCG, QUANTITATIVE, PREGNANCY: hCG, Beta Chain, Quant, S: 555 m[IU]/mL — ABNORMAL HIGH (ref <5)

## 2013-09-07 NOTE — Discharge Instructions (Signed)
Miscarriage A miscarriage is the sudden loss of an unborn baby (fetus) before the 20th week of pregnancy. Most miscarriages happen in the first 3 months of pregnancy. Sometimes, it happens before a woman even knows she is pregnant. A miscarriage is also called a "spontaneous miscarriage" or "early pregnancy loss." Having a miscarriage can be an emotional experience. Talk with your caregiver about any questions you may have about miscarrying, the grieving process, and your future pregnancy plans. CAUSES   Problems with the fetal chromosomes that make it impossible for the baby to develop normally. Problems with the baby's genes or chromosomes are most often the result of errors that occur, by chance, as the embryo divides and grows. The problems are not inherited from the parents.  Infection of the cervix or uterus.   Hormone problems.   Problems with the cervix, such as having an incompetent cervix. This is when the tissue in the cervix is not strong enough to hold the pregnancy.   Problems with the uterus, such as an abnormally shaped uterus, uterine fibroids, or congenital abnormalities.   Certain medical conditions.   Smoking, drinking alcohol, or taking illegal drugs.   Trauma.  Often, the cause of a miscarriage is unknown.  SYMPTOMS   Vaginal bleeding or spotting, with or without cramps or pain.  Pain or cramping in the abdomen or lower back.  Passing fluid, tissue, or blood clots from the vagina. DIAGNOSIS  Your caregiver will perform a physical exam. You may also have an ultrasound to confirm the miscarriage. Blood or urine tests may also be ordered. TREATMENT   Sometimes, treatment is not necessary if you naturally pass all the fetal tissue that was in the uterus. If some of the fetus or placenta remains in the body (incomplete miscarriage), tissue left behind may become infected and must be removed. Usually, a dilation and curettage (D and C) procedure is performed.  During a D and C procedure, the cervix is widened (dilated) and any remaining fetal or placental tissue is gently removed from the uterus.  Antibiotic medicines are prescribed if there is an infection. Other medicines may be given to reduce the size of the uterus (contract) if there is a lot of bleeding.  If you have Rh negative blood and your baby was Rh positive, you will need a Rh immunoglobulin shot. This shot will protect any future baby from having Rh blood problems in future pregnancies. HOME CARE INSTRUCTIONS   Your caregiver may order bed rest or may allow you to continue light activity. Resume activity as directed by your caregiver.  Have someone help with home and family responsibilities during this time.   Keep track of the number of sanitary pads you use each day and how soaked (saturated) they are. Write down this information.   Do not use tampons. Do not douche or have sexual intercourse until approved by your caregiver.   Only take over-the-counter or prescription medicines for pain or discomfort as directed by your caregiver.   Do not take aspirin. Aspirin can cause bleeding.   Keep all follow-up appointments with your caregiver.   If you or your partner have problems with grieving, talk to your caregiver or seek counseling to help cope with the pregnancy loss. Allow enough time to grieve before trying to get pregnant again.  SEEK IMMEDIATE MEDICAL CARE IF:   You have severe cramps or pain in your back or abdomen.  You have a fever.  You pass large blood clots (walnut-sized   or larger) ortissue from your vagina. Save any tissue for your caregiver to inspect.   Your bleeding increases.   You have a thick, bad-smelling vaginal discharge.  You become lightheaded, weak, or you faint.   You have chills.  MAKE SURE YOU:  Understand these instructions.  Will watch your condition.  Will get help right away if you are not doing well or get  worse. Document Released: 08/26/2000 Document Revised: 06/27/2012 Document Reviewed: 04/21/2011 ExitCare Patient Information 2015 [employer]. This information is not intended to replace advice given to you by your health care provider. Make sure you discuss any questions you have with your health care provider.  

## 2013-09-07 NOTE — MAU Note (Signed)
Patient to MAU for repeat BHCG. Denies pain but has a little spotting.

## 2013-09-07 NOTE — MAU Provider Note (Signed)
  History     CSN: [id number]  Arrival date and time: 09/07/13 [id number]   None     Chief Complaint  Patient presents with  . Follow-up   HPI Comments: [name] 28 y.o. 7248w1d [id number] presents to MAU for follow up on her possible miscarriage. She passed what she thought was the fetus on Monday. She has no pain now and only minimal spotting. She has gotten [facility] care with [facility] past but they have not returned her phone calls and she would like to transfer care. She is Rh negative and has had Rhogam.      Past Medical History  Diagnosis Date  . Headache(784.0)   . Abnormal Pap smear     HPV  . Anemia   . Postpartum hemorrhage     Past Surgical History  Procedure Laterality Date  . No past surgeries    . Dilation and evacuation N/A 06/11/2012    Procedure: DILATATION AND EVACUATION;  Surgeon: Kathreen CosierBernard A Marshall, MD;  Location: [facility];  Service: Gynecology;  Laterality: N/A;  with insertion Bakri Balloon  - Emergency    Family History  Problem Relation Age of Onset  . Hypertension Father   . Heart disease Father   . Hearing loss Neg Hx     History  Substance Use Topics  . Smoking status: Current Every Day Smoker -- 0.25 packs/day for 7 years    Types: Cigarettes  . Smokeless tobacco: Never Used  . Alcohol Use: No    Allergies: No Known Allergies  Prescriptions prior to admission  Medication Sig Dispense Refill  . ibuprofen (ADVIL,MOTRIN) 200 MG tablet Take 400 mg by mouth every 6 (six) hours as needed.      . Iron-FA-B Cmp-C-Biot-Probiotic (FUSION PLUS) CAPS Take 1 capsule by mouth daily before breakfast.  30 capsule  5    Review of Systems  Constitutional: Negative.   Respiratory: Negative.   Cardiovascular: Negative.   Gastrointestinal: Negative.   Genitourinary:       Little bleeding  Psychiatric/Behavioral: Negative.    Physical Exam   Blood pressure 110/67, pulse 71, temperature 98.3 F (36.8 C), temperature source Oral, resp. rate 16, last  menstrual period 07/12/2013, SpO2 100.00%.  Physical Exam  Constitutional: She is oriented to person, place, and time. She appears well-developed and well-nourished. No distress.  HENT:  Head: Normocephalic and atraumatic.  Musculoskeletal: Normal range of motion.  Neurological: She is alert and oriented to person, place, and time.  Skin: Skin is warm and dry.  Psychiatric: She has a normal mood and affect. Her behavior is normal. Judgment and thought content normal.   Lab Results  Component Value Date   HCGBETAQNT 555* 09/07/2013   HCGBETAQNT 1607* 09/05/2013      MAU Course  Procedures  MDM  Pt would like to transfer care  Assessment and Plan   A: SAB  P: Repeat Quant Monday June 29th [facility] Follow up in MAU as needed  Carolynn ServeBarefoot, Linda Miller 09/07/2013, 8:43 AM

## 2013-09-07 NOTE — MAU Provider Note (Signed)
Attestation of Attending Supervision of Advanced Practitioner (CNM/NP): Evaluation and management procedures were performed by the Advanced Practitioner under my supervision and collaboration.  I have reviewed the Advanced Practitioner's note and chart, and I agree with the management and plan.  [name],[name] 09/07/2013 9:38 AM

## 2013-09-12 ENCOUNTER — Inpatient Hospital Stay (HOSPITAL_COMMUNITY)
Admission: AD | Admit: 2013-09-12 | Discharge: 2013-09-12 | Disposition: A | Discharge status: Home / Self Care | Payer: Medicaid Other | Source: Ambulatory Visit | Attending: Obstetrics & Gynecology | Admitting: Obstetrics & Gynecology

## 2013-09-12 DIAGNOSIS — O039 Complete or unspecified spontaneous abortion without complication: Secondary | ICD-10-CM

## 2013-09-12 LAB — HCG, QUANTITATIVE, PREGNANCY: hCG, Beta Chain, Quant, S: 115 m[IU]/mL — ABNORMAL HIGH (ref <5)

## 2013-09-12 NOTE — MAU Provider Note (Signed)
Ms. Connie Scottbony S Benning is a 28 y.o. [phone] at 3441w6d who presents to MAU for follow-up labs after SAB. Patient denies abdominal pain today. She has continued to have light bleeding requiring a panty liner only. She denies fever.    BP 131/73  Pulse 62  Temp(Src) 98.1 F (36.7 C) (Oral)  Resp 16  LMP 07/12/2013 GENERAL: Well-developed, well-nourished female in no acute distress.  HEENT: Normocephalic, atraumatic.   LUNGS: Effort normal HEART: Regular rate  SKIN: Warm, dry and without erythema PSYCH: Normal mood and affect  Results for Connie ScottLBRIGHT, Jayliah S (MRN [id number]) as of 09/12/2013 08:49  Ref. Range 09/05/2013 07:50 09/05/2013 09:35 09/07/2013 07:45 09/12/2013 07:55  hCG, Beta Chain, Quant, S Latest Range: <5 mIU/mL 1607 (H)  555 (H) 115 (H)   MDM  A: SAB  P: Discharge home Bleeding precautions discussed Follow-up with [facility]. Referral sent. They will call the patient with an appointment Patient may return to MAU as needed or if her condition were to change or worsen  Freddi StarrJulie N Ethier, PA-C  09/12/2013 8:49 AM

## 2013-09-12 NOTE — MAU Note (Signed)
Doing good, no pain, bleeding almost stopped

## 2013-09-12 NOTE — Discharge Instructions (Signed)
Incomplete Miscarriage °A miscarriage is the sudden loss of an unborn baby (fetus) before the 20th week of pregnancy. In an incomplete miscarriage, parts of the fetus or placenta (afterbirth) remain in the body.  °Having a miscarriage can be an emotional experience. Talk with your health care provider about any questions you may have about miscarrying, the grieving process, and your future pregnancy plans. °CAUSES  °· Problems with the fetal chromosomes that make it impossible for the baby to develop normally. Problems with the baby's genes or chromosomes are most often the result of errors that occur by chance as the embryo divides and grows. The problems are not inherited from the parents. °· Infection of the cervix or uterus. °· Hormone problems. °· Problems with the cervix, such as having an incompetent cervix. This is when the tissue in the cervix is not strong enough to hold the pregnancy. °· Problems with the uterus, such as an abnormally shaped uterus, uterine fibroids, or congenital abnormalities. °· Certain medical conditions. °· Smoking, drinking alcohol, or taking illegal drugs. °· Trauma. °SYMPTOMS  °· Vaginal bleeding or spotting, with or without cramps or pain. °· Pain or cramping in the abdomen or lower back. °· Passing fluid, tissue, or blood clots from the vagina. °DIAGNOSIS  °Your health care provider will perform a physical exam. You may also have an ultrasound to confirm the miscarriage. Blood or urine tests may also be ordered. °TREATMENT  °· Usually, a dilation and curettage (D&C) procedure is performed. During a D&C procedure, the cervix is widened (dilated) and any remaining fetal or placental tissue is gently removed from the uterus. °· Antibiotic medicines are prescribed if there is an infection. Other medicines may be given to reduce the size of the uterus (contract) if there is a lot of bleeding. °· If you have Rh negative blood and your baby was Rh positive, you will need an Rho(D)  immune globulin shot. This shot will protect any future baby from having Rh blood problems in future pregnancies. °· You may be confined to bed rest. This means you should stay in bed and only get up to use the bathroom. °HOME CARE INSTRUCTIONS  °· Rest as directed by your health care provider. °· Restrict activity as directed by your health care provider. You may be allowed to continue light activity if curettage was not done but you require further treatment. °· Keep track of the number of pads you use each day. Keep track of how soaked (saturated) they are. Record this information. °· Do not  use tampons. °· Do not douche or have sexual intercourse until approved by your health care provider. °· Keep all follow-up appointments for re-evaluation and continuing management. °· Only take over-the-counter or prescription medicines for pain, fever, or discomfort as directed by your health care provider. °· Take antibiotic medicine as directed by your health care provider. Make sure you finish it even if you start to feel better. °SEEK IMMEDIATE MEDICAL CARE IF:  °· You experience severe cramps in your stomach, back, or abdomen. °· You have an unexplained temperature (make sure to record these temperatures). °· You pass large clots or tissue (save these for your health care provider to inspect). °· Your bleeding increases. °· You become light-headed, weak, or have fainting episodes. °MAKE SURE YOU:  °· Understand these instructions. °· Will watch your condition. °· Will get help right away if you are not doing well or get worse. °Document Released: 03/02/2005 Document Revised: 12/21/2012 Document Reviewed: 09/29/2012 °  ExitCare® Patient Information ©2015 [employer]. This information is not intended to replace advice given to you by your health care provider. Make sure you discuss any questions you have with your health care provider. ° °

## 2013-10-04 ENCOUNTER — Encounter: Payer: Medicaid Other | Admitting: Obstetrics & Gynecology

## 2014-01-15 ENCOUNTER — Encounter (HOSPITAL_COMMUNITY): Payer: Self-pay

## 2014-06-10 ENCOUNTER — Emergency Department (HOSPITAL_COMMUNITY)
Admission: EM | Admit: 2014-06-10 | Discharge: 2014-06-10 | Disposition: A | Discharge status: Home / Self Care | Payer: Medicaid Other | Attending: Emergency Medicine | Admitting: Emergency Medicine

## 2014-06-10 ENCOUNTER — Emergency Department (HOSPITAL_COMMUNITY): Payer: Medicaid Other

## 2014-06-10 ENCOUNTER — Encounter (HOSPITAL_COMMUNITY): Payer: Self-pay | Admitting: *Deleted

## 2014-06-10 DIAGNOSIS — R51 Headache: Secondary | ICD-10-CM | POA: Diagnosis present

## 2014-06-10 DIAGNOSIS — R630 Anorexia: Secondary | ICD-10-CM | POA: Diagnosis not present

## 2014-06-10 DIAGNOSIS — J111 Influenza due to unidentified influenza virus with other respiratory manifestations: Secondary | ICD-10-CM | POA: Insufficient documentation

## 2014-06-10 DIAGNOSIS — Z720 Tobacco use: Secondary | ICD-10-CM | POA: Insufficient documentation

## 2014-06-10 DIAGNOSIS — Z862 Personal history of diseases of the blood and blood-forming organs and certain disorders involving the immune mechanism: Secondary | ICD-10-CM | POA: Diagnosis not present

## 2014-06-10 LAB — BASIC METABOLIC PANEL
ANION GAP: 10 (ref 5–15)
BUN: 7 mg/dL (ref 6–23)
CHLORIDE: 105 mmol/L (ref 96–112)
CO2: 23 mmol/L (ref 19–32)
Calcium: 9.2 mg/dL (ref 8.4–10.5)
Creatinine, Ser: 0.84 mg/dL (ref 0.50–1.10)
GFR calc Af Amer: >90 mL/min (ref >90)
GFR calc non Af Amer: >90 mL/min (ref >90)
GLUCOSE: 111 mg/dL — AB (ref 70–99)
Potassium: 3 mmol/L — ABNORMAL LOW (ref 3.5–5.1)
SODIUM: 138 mmol/L (ref 135–145)

## 2014-06-10 LAB — URINALYSIS, ROUTINE W REFLEX MICROSCOPIC
BILIRUBIN URINE: NEGATIVE
Glucose, UA: NEGATIVE mg/dL
HGB URINE DIPSTICK: NEGATIVE
Ketones, ur: 15 mg/dL — AB
LEUKOCYTES UA: NEGATIVE
Nitrite: NEGATIVE
PROTEIN: 30 mg/dL — AB
Specific Gravity, Urine: 1.026 (ref 1.005–1.030)
UROBILINOGEN UA: 1 mg/dL (ref 0.0–1.0)
pH: 8.5 — ABNORMAL HIGH (ref 5.0–8.0)

## 2014-06-10 LAB — CBC WITH DIFFERENTIAL/PLATELET
BASOS PCT: 0 % (ref 0–1)
Basophils Absolute: 0 10*3/uL (ref 0.0–0.1)
EOS PCT: 0 % (ref 0–5)
Eosinophils Absolute: 0 10*3/uL (ref 0.0–0.7)
HCT: 35.2 % — ABNORMAL LOW (ref 36.0–46.0)
Hemoglobin: 12 g/dL (ref 12.0–15.0)
LYMPHS ABS: 0.8 10*3/uL (ref 0.7–4.0)
Lymphocytes Relative: 5 % — ABNORMAL LOW (ref 12–46)
MCH: 30.1 pg (ref 26.0–34.0)
MCHC: 34.1 g/dL (ref 30.0–36.0)
MCV: 88.2 fL (ref 78.0–100.0)
MONO ABS: 1.1 10*3/uL — AB (ref 0.1–1.0)
MONOS PCT: 6 % (ref 3–12)
Neutro Abs: 15.3 10*3/uL — ABNORMAL HIGH (ref 1.7–7.7)
Neutrophils Relative %: 89 % — ABNORMAL HIGH (ref 43–77)
PLATELETS: 239 10*3/uL (ref 150–400)
RBC: 3.99 MIL/uL (ref 3.87–5.11)
RDW: 12.9 % (ref 11.5–15.5)
WBC: 17.2 10*3/uL — ABNORMAL HIGH (ref 4.0–10.5)

## 2014-06-10 LAB — URINE MICROSCOPIC-ADD ON

## 2014-06-10 LAB — I-STAT BETA HCG BLOOD, ED (MC, WL, AP ONLY): I-stat hCG, quantitative: <5 m[IU]/mL (ref <5)

## 2014-06-10 LAB — RAPID STREP SCREEN (MED CTR MEBANE ONLY): Streptococcus, Group A Screen (Direct): NEGATIVE

## 2014-06-10 MED ORDER — NAPROXEN 500 MG PO TABS: 500.0000 mg | ORAL_TABLET | Freq: Two times a day (BID) | ORAL | Status: DC

## 2014-06-10 MED ORDER — IBUPROFEN 200 MG PO TABS
600.0000 mg | ORAL_TABLET | Freq: Once | ORAL | Status: AC
  Administered 2014-06-10: 600 mg via ORAL
  Filled 2014-06-10: qty 3

## 2014-06-10 MED ORDER — SODIUM CHLORIDE 0.9 % IV BOLUS (SEPSIS)
1000.0000 mL | Freq: Once | INTRAVENOUS | Status: AC
  Administered 2014-06-10: 1000 mL via INTRAVENOUS

## 2014-06-10 MED ORDER — PROMETHAZINE HCL 25 MG PO TABS: 25.0000 mg | ORAL_TABLET | Freq: Four times a day (QID) | ORAL | Status: DC | PRN

## 2014-06-10 MED ORDER — OSELTAMIVIR PHOSPHATE 75 MG PO CAPS: 75.0000 mg | ORAL_CAPSULE | Freq: Two times a day (BID) | ORAL | Status: DC

## 2014-06-10 NOTE — ED Notes (Signed)
Pt woke up at 5am with body ache, vomiting and sore throat.

## 2014-06-10 NOTE — Discharge Instructions (Signed)

## 2014-06-10 NOTE — ED Notes (Signed)
Off unit with xray. 

## 2014-06-10 NOTE — ED Provider Notes (Signed)
CSN: [id number]     Arrival date & time 06/10/14  1004 History   First MD Initiated Contact with Patient 06/10/14 1011     Chief Complaint  Patient presents with  . Influenza    Patient is a 29 y.o. female presenting with flu symptoms. The history is provided by the patient.  Influenza Presenting symptoms: headache, myalgias, nausea, sore throat and vomiting   Presenting symptoms: no cough, no diarrhea, no fever, no rhinorrhea and no shortness of breath   Chronicity:  New Relieved by:  Nothing Ineffective treatments:  None tried Associated symptoms: chills and decreased appetite    the patient started feeling like she was coming down with something last night. She had a headache and this morning when she woke up she had diffuse body aches. Her entire body is sore. She has not measured any fevers at home. She does have a sore throat. She said some vomiting but no diarrhea. Patient did not get her flu shot this year  Past Medical History  Diagnosis Date  . Headache(784.0)   . Abnormal Pap smear     HPV  . Anemia   . Postpartum hemorrhage    Past Surgical History  Procedure Laterality Date  . No past surgeries    . Dilation and evacuation N/A 06/11/2012    Procedure: DILATATION AND EVACUATION;  Surgeon: Kathreen Cosier, MD;  Location: [facility];  Service: Gynecology;  Laterality: N/A;  with insertion Bakri Balloon  - Emergency   Family History  Problem Relation Age of Onset  . Hypertension Father   . Heart disease Father   . Hearing loss Neg Hx    History  Substance Use Topics  . Smoking status: Current Every Day Smoker -- 0.25 packs/day for 7 years    Types: Cigarettes  . Smokeless tobacco: Never Used  . Alcohol Use: No   OB History    Gravida Para Term Preterm AB TAB SAB Ectopic Multiple Living   0 0 0 0 0 0 3     Review of Systems  Constitutional: Positive for chills and decreased appetite. Negative for fever.  HENT: Positive for sore throat. Negative for  rhinorrhea.   Respiratory: Negative for cough and shortness of breath.   Gastrointestinal: Positive for nausea and vomiting. Negative for diarrhea.  Musculoskeletal: Positive for myalgias.  Neurological: Positive for headaches.  All other systems reviewed and are negative.     Allergies  Review of patient's allergies indicates no known allergies.  Home Medications   Prior to Admission medications   Medication Sig Start Date End Date Taking? Authorizing Provider  guaiFENesin (ROBITUSSIN) 100 MG/5ML SOLN Take 5 mLs by mouth every 4 (four) hours as needed for cough or to loosen phlegm.   Yes Historical Provider, MD  Iron-FA-B Cmp-C-Biot-Probiotic (FUSION PLUS) CAPS Take 1 capsule by mouth daily before breakfast. Patient not taking: Reported on 06/10/2014 06/13/12   Brock Bad, MD  naproxen (NAPROSYN) 500 MG tablet Take 1 tablet (500 mg total) by mouth 2 (two) times daily. 06/10/14   Linwood Dibbles, MD  oseltamivir (TAMIFLU) 75 MG capsule Take 1 capsule (75 mg total) by mouth 2 (two) times daily. 06/10/14   Linwood Dibbles, MD  promethazine (PHENERGAN) 25 MG tablet Take 1 tablet (25 mg total) by mouth every 6 (six) hours as needed for nausea or vomiting. 06/10/14   Linwood Dibbles, MD   BP 125/61 mmHg  Pulse 76  Temp(Src) 98.7 F (37.1 C) (Oral)  Resp 16  SpO2 100%  LMP 06/04/2014  Breastfeeding? Unknown Physical Exam  Constitutional: She appears well-developed and well-nourished.  HENT:  Head: Normocephalic and atraumatic.  Right Ear: External ear normal.  Left Ear: External ear normal.  Eyes: Conjunctivae are normal. Right eye exhibits no discharge. Left eye exhibits no discharge. No scleral icterus.  Neck: Normal range of motion. Neck supple. No tracheal deviation present.  Cardiovascular: Normal rate, regular rhythm and intact distal pulses.   Pulmonary/Chest: Effort normal and breath sounds normal. No stridor. No respiratory distress. She has no wheezes. She has no rales.  Decreased breath  sounds on the right side  Abdominal: Soft. Bowel sounds are normal. She exhibits no distension. There is no tenderness. There is no rebound and no guarding.  Musculoskeletal: She exhibits tenderness. She exhibits no edema.  Diffuse tenderness in her extremities and muscles without evidence of inflammation or joint effusions  Neurological: She is alert. She has normal strength. No cranial nerve deficit (no facial droop, extraocular movements intact, no slurred speech) or sensory deficit. She exhibits normal muscle tone. She displays no seizure activity. Coordination normal.  Skin: Skin is warm and dry. No rash noted. She is not diaphoretic.  Psychiatric: She has a normal mood and affect.  Nursing note and vitals reviewed.   ED Course  Procedures (including critical care time) Labs Review Labs Reviewed  URINALYSIS, ROUTINE W REFLEX MICROSCOPIC - Abnormal; Notable for the following:    Color, Urine AMBER (*)    APPearance CLOUDY (*)    pH 8.5 (*)    Ketones, ur 15 (*)    Protein, ur 30 (*)    All other components within normal limits  CBC WITH DIFFERENTIAL/PLATELET - Abnormal; Notable for the following:    WBC 17.2 (*)    HCT 35.2 (*)    Neutrophils Relative % 89 (*)    Neutro Abs 15.3 (*)    Lymphocytes Relative 5 (*)    Monocytes Absolute 1.1 (*)    All other components within normal limits  BASIC METABOLIC PANEL - Abnormal; Notable for the following:    Potassium 3.0 (*)    Glucose, Bld 111 (*)    All other components within normal limits  URINE MICROSCOPIC-ADD ON - Abnormal; Notable for the following:    Squamous Epithelial / LPF MANY (*)    Bacteria, UA FEW (*)    All other components within normal limits  RAPID STREP SCREEN  URINE CULTURE  CULTURE, GROUP A STREP  I-STAT BETA HCG BLOOD, ED ([facility], [facility], [facility] ONLY)    Imaging Review Dg Chest 2 View  06/10/2014   CLINICAL DATA:  Productive cough, chills  EXAM: CHEST  2 VIEW  COMPARISON:  None.  FINDINGS: Cardiomediastinal  silhouette is unremarkable. No acute infiltrate or pulmonary edema. Mild infrahilar bronchitic changes right greater than left. Mild hyperinflation. Bony thorax is unremarkable.  IMPRESSION: Mild hyperinflation. No acute infiltrate or pulmonary edema. Mild infrahilar bronchitic changes right greater than left.   Electronically Signed   By: Natasha MeadLiviu  Pop M.D.   On: 06/10/2014 11:22     MDM   Final diagnoses:  Influenza    Patient's symptoms are consistent with influenza. Her strep screen was negative. Chest x-ray does not show pneumonia. She was given IV fluids and medications for symptomatic relief. She is feeling better after treatment in the emergency department. The patient does request a prescription for Tamiflu.  At this time there does not appear to be any evidence  of an acute emergency medical condition and the patient appears stable for discharge with appropriate outpatient follow up.     Linwood Dibbles, MD 06/10/14 [phone]

## 2014-06-11 LAB — URINE CULTURE
Colony Count: >=100000
Special Requests: NORMAL

## 2014-06-13 LAB — CULTURE, GROUP A STREP: STREP A CULTURE: NEGATIVE

## 2014-07-23 ENCOUNTER — Encounter (HOSPITAL_COMMUNITY): Payer: Self-pay | Admitting: *Deleted

## 2014-07-23 ENCOUNTER — Inpatient Hospital Stay (HOSPITAL_COMMUNITY)
Admission: AD | Admit: 2014-07-23 | Discharge: 2014-07-23 | Disposition: A | Discharge status: Home / Self Care | Payer: Medicaid Other | Source: Ambulatory Visit | Attending: Obstetrics and Gynecology | Admitting: Obstetrics and Gynecology

## 2014-07-23 DIAGNOSIS — N912 Amenorrhea, unspecified: Secondary | ICD-10-CM | POA: Insufficient documentation

## 2014-07-23 DIAGNOSIS — Z3201 Encounter for pregnancy test, result positive: Secondary | ICD-10-CM | POA: Diagnosis not present

## 2014-07-23 DIAGNOSIS — N926 Irregular menstruation, unspecified: Secondary | ICD-10-CM

## 2014-07-23 DIAGNOSIS — Z3200 Encounter for pregnancy test, result unknown: Secondary | ICD-10-CM | POA: Diagnosis present

## 2014-07-23 LAB — POCT PREGNANCY, URINE: Preg Test, Ur: POSITIVE — AB

## 2014-07-23 NOTE — MAU Note (Signed)
Urine in lab 

## 2014-07-23 NOTE — MAU Note (Signed)
+  HPT ~ 3 wks ago.  Needs verification letter for medicaid. No problems

## 2014-07-23 NOTE — MAU Note (Signed)
Only problem she has had is that her "hips are popping", did this with her last preg also.

## 2014-07-23 NOTE — MAU Provider Note (Signed)
  Subjective:   [name] is a 29 y.o. female [phone] who presents for a pregnancy verification letter. She had a positive pregnancy test at home. She currently denies pain or bleeding.    Objective:  GENERAL: Well-developed, well-nourished female in no acute distress.  LUNGS: Effort normal SKIN: Warm, dry and without erythema PSYCH: Normal mood and affect  Filed Vitals:   07/23/14 1308  BP: 122/72  Pulse: 78  Temp: 98.5 F (36.9 C)  TempSrc: Oral  Resp: 18  Height: 5\' 7"  (1.702 m)  Weight: 56.87 kg (125 lb 6 oz)  SpO2: 100%    Results for orders placed or performed during the hospital encounter of 07/23/14 (from the past 48 hour(s))  Pregnancy, urine POC     Status: Abnormal   Collection Time: 07/23/14  1:19 PM  Result Value Ref Range   Preg Test, Ur POSITIVE (A) NEGATIVE    Comment:        THE SENSITIVITY OF THIS METHODOLOGY IS >24 mIU/mL     Assessment:  1. Missed period     Plan:   Discharge home in stable condition Start prenatal care ASAP  First trimester warning signs discussed  Prenatal vitamins daily.      Duane LopeJennifer I Rasch, NP 07/23/2014 1:26 PM

## 2014-07-23 NOTE — MAU Note (Signed)
Pt. States had home preg. Test  3wks ago and it was positive. Want to no # of weeks and make sure baby is ok. Pt. States she had miscarriage about 8 months ago. Pt. Have 3 living children.

## 2014-08-09 ENCOUNTER — Inpatient Hospital Stay (HOSPITAL_COMMUNITY)
Admission: EM | Admit: 2014-08-09 | Discharge: 2014-08-09 | Disposition: A | Discharge status: Home / Self Care | Payer: Medicaid Other | Source: Ambulatory Visit | Attending: Obstetrics & Gynecology | Admitting: Obstetrics & Gynecology

## 2014-08-09 ENCOUNTER — Encounter (HOSPITAL_COMMUNITY): Payer: Self-pay

## 2014-08-09 DIAGNOSIS — O9989 Other specified diseases and conditions complicating pregnancy, childbirth and the puerperium: Secondary | ICD-10-CM | POA: Insufficient documentation

## 2014-08-09 DIAGNOSIS — Z3A10 10 weeks gestation of pregnancy: Secondary | ICD-10-CM | POA: Insufficient documentation

## 2014-08-09 DIAGNOSIS — O99331 Smoking (tobacco) complicating pregnancy, first trimester: Secondary | ICD-10-CM | POA: Diagnosis not present

## 2014-08-09 DIAGNOSIS — Z8249 Family history of ischemic heart disease and other diseases of the circulatory system: Secondary | ICD-10-CM | POA: Diagnosis not present

## 2014-08-09 DIAGNOSIS — R1013 Epigastric pain: Secondary | ICD-10-CM | POA: Insufficient documentation

## 2014-08-09 DIAGNOSIS — F17210 Nicotine dependence, cigarettes, uncomplicated: Secondary | ICD-10-CM | POA: Diagnosis not present

## 2014-08-09 DIAGNOSIS — O219 Vomiting of pregnancy, unspecified: Secondary | ICD-10-CM | POA: Insufficient documentation

## 2014-08-09 DIAGNOSIS — R112 Nausea with vomiting, unspecified: Secondary | ICD-10-CM | POA: Diagnosis present

## 2014-08-09 LAB — URINALYSIS, ROUTINE W REFLEX MICROSCOPIC
BILIRUBIN URINE: NEGATIVE
GLUCOSE, UA: NEGATIVE mg/dL
HGB URINE DIPSTICK: NEGATIVE
Ketones, ur: NEGATIVE mg/dL
LEUKOCYTES UA: NEGATIVE
Nitrite: NEGATIVE
PROTEIN: NEGATIVE mg/dL
SPECIFIC GRAVITY, URINE: 1.025 (ref 1.005–1.030)
Urobilinogen, UA: 0.2 mg/dL (ref 0.0–1.0)
pH: 7.5 (ref 5.0–8.0)

## 2014-08-09 MED ORDER — METOCLOPRAMIDE HCL 10 MG PO TABS: 10.0000 mg | ORAL_TABLET | Freq: Three times a day (TID) | ORAL | Status: DC | PRN

## 2014-08-09 NOTE — Discharge Instructions (Signed)
Nausea medication to take during pregnancy:   Unisom (doxylamine succinate 25 mg tablets) Take one tablet daily at bedtime. If symptoms are not adequately controlled, the dose can be increased to a maximum recommended dose of two tablets daily (1/2 tablet in the morning, 1/2 tablet mid-afternoon and one at bedtime).  Vitamin B6  tablets. Take one tablet twice a day (up to 200 mg per day).   Morning Sickness Morning sickness is when you feel sick to your stomach (nauseous) during pregnancy. This nauseous feeling may or may not come with vomiting. It often occurs in the morning but can be a problem any time of day. Morning sickness is most common during the first trimester, but it may continue throughout pregnancy. While morning sickness is unpleasant, it is usually harmless unless you develop severe and continual vomiting (hyperemesis gravidarum). This condition requires more intense treatment.  CAUSES  The cause of morning sickness is not completely known but seems to be related to normal hormonal changes that occur in pregnancy. RISK FACTORS You are at greater risk if you:  Experienced nausea or vomiting before your pregnancy.  Had morning sickness during a previous pregnancy.  Are pregnant with more than one baby, such as twins. TREATMENT  Do not use any medicines (prescription, over-the-counter, or herbal) for morning sickness without first talking to your health care provider. Your health care provider may prescribe or recommend:  Vitamin B6 supplements.  Anti-nausea medicines.  The herbal medicine ginger. HOME CARE INSTRUCTIONS   Only take over-the-counter or prescription medicines as directed by your health care provider.  Taking multivitamins before getting pregnant can prevent or decrease the severity of morning sickness in most women.  Eat a piece of dry toast or unsalted crackers before getting out of bed in the morning.  Eat five or six small meals a day.  Eat dry  and bland foods (rice, baked potato). Foods high in carbohydrates are often helpful.  Do not drink liquids with your meals. Drink liquids between meals.  Avoid greasy, fatty, and spicy foods.  Get someone to cook for you if the smell of any food causes nausea and vomiting.  If you feel nauseous after taking prenatal vitamins, take the vitamins at night or with a snack.  Snack on protein foods (nuts, yogurt, cheese) between meals if you are hungry.  Eat unsweetened gelatins for desserts.  Wearing an acupressure wristband (worn for sea sickness) may be helpful.  Acupuncture may be helpful.  Do not smoke.  Get a humidifier to keep the air in your house free of odors.  Get plenty of fresh air. SEEK MEDICAL CARE IF:   Your home remedies are not working, and you need medicine.  You feel dizzy or lightheaded.  You are losing weight. SEEK IMMEDIATE MEDICAL CARE IF:   You have persistent and uncontrolled nausea and vomiting.  You pass out (faint). MAKE SURE YOU:  Understand these instructions.  Will watch your condition.  Will get help right away if you are not doing well or get worse. Document Released: 04/23/2006 Document Revised: 03/07/2013 Document Reviewed: 08/17/2012 [facility] Patient Information 2015 [place], [place]. This information is not intended to replace advice given to you by your health care provider. Make sure you discuss any questions you have with your health care provider.  Prenatal Care [facility]    [facility]  [phone]     Phone: [phone]  [facility]                      [facility]  @[email or web address]     Phone: [phone]  Phone: [phone]        [facility]     Phone: [phone]  Phone: [phone]          [facility] [place]                Phone: [phone]  Phone:  [phone]        [facility]         Phone: [phone]          [facility]                Phone: [phone]  [facility]   [address]:[phone]    [facility]          Phone: [phone] [facility]   Phone: [phone]

## 2014-08-09 NOTE — Progress Notes (Signed)
Patient states she has been having some nausea in the mornings however this morning ate breakfast and vomited.

## 2014-08-09 NOTE — MAU Provider Note (Signed)
History     CSN: [id number]  Arrival date and time: 08/09/14 1133   None     Chief Complaint  Patient presents with  . Emesis During Pregnancy   HPI [name] 29 y.o. [id number] @[redacted]w[redacted]d  by uncertain LMP presents with nausea and vomiting.  She reports the nausea has been present in the am for 5+ days however new today she has had vomiting.  She notes 4 or more episodes of vomiting between 5am and 10am.  She has not tried any medications.  She continues to feel nauseous even now.  She is own driver.  She denies difficulty eating, lower abdominal pain, vaginal bleeding or discharge, dysuria, fever, weakness.  NO sick contacts.   OB History    Gravida Para Term Preterm AB TAB SAB Ectopic Multiple Living   5 3 3  0 0 0 0 0 0 3      Past Medical History  Diagnosis Date  . Headache(784.0)   . Abnormal Pap smear     HPV  . Anemia   . Postpartum hemorrhage     Past Surgical History  Procedure Laterality Date  . No past surgeries    . Dilation and evacuation N/A 06/11/2012    Procedure: DILATATION AND EVACUATION;  Surgeon: Kathreen CosierBernard A Marshall, MD;  Location: [facility];  Service: Gynecology;  Laterality: N/A;  with insertion Bakri Balloon  - Emergency    Family History  Problem Relation Age of Onset  . Hypertension Father   . Heart disease Father   . Hearing loss Neg Hx     History  Substance Use Topics  . Smoking status: Current Every Day Smoker -- 0.25 packs/day for 7 years    Types: Cigarettes  . Smokeless tobacco: Never Used  . Alcohol Use: No    Allergies: No Known Allergies  Prescriptions prior to admission  Medication Sig Dispense Refill Last Dose  . oseltamivir (TAMIFLU) 75 MG capsule Take 1 capsule (75 mg total) by mouth 2 (two) times daily. 10 capsule 0   . promethazine (PHENERGAN) 25 MG tablet Take 1 tablet (25 mg total) by mouth every 6 (six) hours as needed for nausea or vomiting. 12 tablet 0     ROS Pertinent ROS in HPI.  All other systems are negative.    Physical Exam   Blood pressure 128/60, pulse 88, temperature 98.7 F (37.1 C), temperature source Oral, resp. rate 18, height 5\' 8"  (1.727 m), weight 124 lb 3.2 oz (56.337 kg), last menstrual period 05/29/2014, unknown if currently breastfeeding.  Physical Exam  Constitutional: She is oriented to person, place, and time. She appears well-developed and well-nourished. No distress.  HENT:  Head: Normocephalic and atraumatic.  Eyes: EOM are normal.  Neck: Normal range of motion.  Cardiovascular: Normal rate.   Respiratory: Effort normal. No respiratory distress.  GI: Soft. She exhibits no distension. There is no tenderness. There is no rebound and no guarding.  Musculoskeletal: Normal range of motion.  Neurological: She is alert and oriented to person, place, and time.  Skin: Skin is warm and dry.  Psychiatric: She has a normal mood and affect.   Results for orders placed or performed during the hospital encounter of 08/09/14 (from the past 24 hour(s))  Urinalysis, Routine w reflex microscopic ([facility])     Status: Abnormal   Collection Time: 08/09/14 12:01 PM  Result Value Ref Range   Color, Urine YELLOW YELLOW   APPearance HAZY (A) CLEAR   Specific Gravity,  Urine 1.025 1.005 - 1.030   pH 7.5 5.0 - 8.0   Glucose, UA NEGATIVE NEGATIVE mg/dL   Hgb urine dipstick NEGATIVE NEGATIVE   Bilirubin Urine NEGATIVE NEGATIVE   Ketones, ur NEGATIVE NEGATIVE mg/dL   Protein, ur NEGATIVE NEGATIVE mg/dL   Urobilinogen, UA 0.2 0.0 - 1.0 mg/dL   Nitrite NEGATIVE NEGATIVE   Leukocytes, UA NEGATIVE NEGATIVE    MAU Course  Procedures  MDM Bedside u/s was used to detect fetal cardiac activity although dating is likely inaccurate U/A confirms no dehydration  Assessment and Plan  A:  1. Nausea and vomiting in pregnancy prior to [redacted] weeks gestation    P: Discharge to home Pt has NOB scheduled next week with [facility] prn nausea Nausea medication to take during pregnancy:   Unisom  (doxylamine succinate 25 mg tablets) Take one tablet daily at bedtime. If symptoms are not adequately controlled, the dose can be increased to a maximum recommended dose of two tablets daily (1/2 tablet in the morning, 1/2 tablet mid-afternoon and one at bedtime).  Vitamin B6  tablets. Take one tablet twice a day (up to 200 mg per day).  Patient may return to MAU as needed or if her condition were to change or worsen   Bertram Denver 08/09/2014, 12:30 PM

## 2014-08-09 NOTE — MAU Provider Note (Signed)
History    CSN: [id number]  Arrival date and time: 08/09/14 1133  Chief Complaint  Patient presents with  . Emesis During Pregnancy   HPI   [name] is a 29yo [phone] pregnant female @[redacted]w[redacted]d  [place] presenting with abdominal pain, nausea, and vomiting. She endorses baseline nausea, decreased appetite, and fatigue this pregnancy but has never vomited. Endorses emesis x6 5:15 - 10:20am this morning. She vomited yesterday's dinner and breakfast this morning. Denies bloody or bilious emesis. Endorses 8/10 epigastric non-radiating abdominal pain not relieved by positional changes. Ginger ale does not improve symptoms. Pt has not taken medications for N/V, abdominal pain. Endorses nausea but denies emesis in previous pregnancies or miscarriages. Yesterday she ate "food off the grill" which is not atypical for her. No one shared the dinner is sick, denies sick contacts, denies travel. Denies constipation, diarrhea. Denies new stressors.   First prenatal appointment is scheduled for 08/16/14.  Past Medical History  Diagnosis Date  . Headache(784.0)   . Abnormal Pap smear     HPV  . Anemia   . Postpartum hemorrhage     Past Surgical History  Procedure Laterality Date  . No past surgeries    . Dilation and evacuation N/A 06/11/2012    Procedure: DILATATION AND EVACUATION;  Surgeon: Kathreen CosierBernard A Marshall, MD;  Location: [facility];  Service: Gynecology;  Laterality: N/A;  with insertion Bakri Balloon  - Emergency    Family History  Problem Relation Age of Onset  . Hypertension Father   . Heart disease Father   . Hearing loss Neg Hx     History  Substance Use Topics  . Smoking status: Current Every Day Smoker -- 0.25 packs/day for 7 years    Types: Cigarettes  . Smokeless tobacco: Never Used  . Alcohol Use: No  [place] children and boyfriend of 6464yrs. Endorses it is a positive, safe relationship.   Allergies: No Known Allergies  Prescriptions prior to admission   Medication Sig Dispense Refill Last Dose  . oseltamivir (TAMIFLU) 75 MG capsule Take 1 capsule (75 mg total) by mouth 2 (two) times daily. 10 capsule 0   . promethazine (PHENERGAN) 25 MG tablet Take 1 tablet (25 mg total) by mouth every 6 (six) hours as needed for nausea or vomiting. 12 tablet 0   Not taking Tamiflu or Phenergan currently. Pt is taking gummy prenatal vitamins.   ROS  Denies chest pain, dyspnea, headache, changes in vision.  Denies abnormal vaginal discharge. Endorses clear discharge similar to her usual vaginal discharge. Denies vaginal itching, odor, bleeding. Denies recent STI (last Trich infection years ago, treated).   Physical Exam   Blood pressure 128/60, pulse 88, temperature 98.7 F (37.1 C), temperature source Oral, resp. rate 18, height 5\' 8"  (1.727 m), weight 56.337 kg (124 lb 3.2 oz), last menstrual period 05/29/2014, unknown if currently breastfeeding.  Physical Exam  General: well-appearing, in NAD, appears tired  HEENT: NCAT, MMM CV: RRR, no murmurs Pulm: lungs CTAB, no increased WOB Abd: nontender, +bowel sounds  MAU Course  Procedures  MDM UA hazy, negative for glucose, ketones, leukocyte, nitrites, blood, protein Abdominal exam demonstrated abdomen nontender to palpation US used to visualize uterus, fetal heartbeat   Assessment and Plan  [name] is a 29yo [id number] previously healthy female presenting with epigastric abdominal pain, N/V in the setting of first trimester pregnancy. Most likely N/V a/w pregnancy. Uterus and fetal heartbeat visualized with US. Medically manage, f/u at prenatal appt.  1. N/V, abd pain: likely 2/2 first trimester pregnancy - Reglan - doxylamine - Vit B6 - Explained side effect of dizziness - Gave pt list of prenatal care providers. f/u at prenatal appt on 08/16/14   Mattalyn Anderegg, Medical Student 08/09/2014, 12:53 PM

## 2014-08-09 NOTE — MAU Note (Signed)
Pt reports she has has morning sickness before but just nausea. Today is the first time she had to vomit and has vomited several times.

## 2014-08-28 ENCOUNTER — Encounter: Payer: Medicaid Other | Admitting: Certified Nurse Midwife

## 2014-09-06 ENCOUNTER — Encounter: Payer: Self-pay | Admitting: Certified Nurse Midwife

## 2014-09-06 ENCOUNTER — Ambulatory Visit (INDEPENDENT_AMBULATORY_CARE_PROVIDER_SITE_OTHER): Payer: Medicaid Other | Admitting: Certified Nurse Midwife

## 2014-09-06 VITALS — BP 112/64 | HR 89 | Temp 98.8°F | Wt 122.0 lb

## 2014-09-06 DIAGNOSIS — N760 Acute vaginitis: Secondary | ICD-10-CM

## 2014-09-06 DIAGNOSIS — M25552 Pain in left hip: Secondary | ICD-10-CM

## 2014-09-06 DIAGNOSIS — Z3482 Encounter for supervision of other normal pregnancy, second trimester: Secondary | ICD-10-CM

## 2014-09-06 DIAGNOSIS — Z3687 Encounter for antenatal screening for uncertain dates: Secondary | ICD-10-CM

## 2014-09-06 DIAGNOSIS — O219 Vomiting of pregnancy, unspecified: Secondary | ICD-10-CM

## 2014-09-06 DIAGNOSIS — A499 Bacterial infection, unspecified: Secondary | ICD-10-CM

## 2014-09-06 DIAGNOSIS — B9689 Other specified bacterial agents as the cause of diseases classified elsewhere: Secondary | ICD-10-CM

## 2014-09-06 DIAGNOSIS — Z36 Encounter for antenatal screening of mother: Secondary | ICD-10-CM

## 2014-09-06 LAB — POCT URINALYSIS DIPSTICK
BILIRUBIN UA: NEGATIVE
Glucose, UA: NEGATIVE
Ketones, UA: NEGATIVE
LEUKOCYTES UA: NEGATIVE
NITRITE UA: NEGATIVE
PH UA: 6.5
PROTEIN UA: NEGATIVE
RBC UA: NEGATIVE
Spec Grav, UA: 1.015
UROBILINOGEN UA: NEGATIVE

## 2014-09-06 MED ORDER — METRONIDAZOLE 0.75 % VA GEL: 1.0000 | Freq: Two times a day (BID) | VAGINAL | Status: DC

## 2014-09-06 MED ORDER — DOXYLAMINE-PYRIDOXINE 10-10 MG PO TBEC: 2.0000 | DELAYED_RELEASE_TABLET | Freq: Every day | ORAL | Status: DC

## 2014-09-06 MED ORDER — FLUCONAZOLE 100 MG PO TABS: 100.0000 mg | ORAL_TABLET | Freq: Once | ORAL | Status: DC

## 2014-09-06 MED ORDER — ONDANSETRON HCL 4 MG PO TABS: 4.0000 mg | ORAL_TABLET | Freq: Every day | ORAL | Status: DC | PRN

## 2014-09-06 MED ORDER — GABAPENTIN 600 MG PO TABS: 600.0000 mg | ORAL_TABLET | Freq: Three times a day (TID) | ORAL | Status: DC

## 2014-09-06 NOTE — Progress Notes (Signed)
Subjective:    [name] is being seen today for her first obstetrical visit.  This is a planned pregnancy. She is at [redacted]w[redacted]d gestation. Her obstetrical history is significant for hemorrhage postpartum with last pregnancy requiring the Bakari, no blood transfusions. . Relationship with FOB: significant other, living together. Patient does intend to breast feed. Pregnancy history fully reviewed.  The information documented in the HPI was reviewed and verified.  Menstrual History: OB History    Gravida Para Term Preterm AB TAB SAB Ectopic Multiple Living   0 1 0 1 0 0 3      Menarche age: 3  Patient's last menstrual period was 05/29/2014 (approximate). Unsure of LMP.    Past Medical History  Diagnosis Date  . Headache(784.0)   . Abnormal Pap smear     HPV  . Anemia   . Postpartum hemorrhage     Past Surgical History  Procedure Laterality Date  . No past surgeries    . Dilation and evacuation N/A 06/11/2012    Procedure: DILATATION AND EVACUATION;  Surgeon: Kathreen Cosier, MD;  Location: [facility];  Service: Gynecology;  Laterality: N/A;  with insertion Bakri Balloon  - Emergency     (Not in a hospital admission) No Known Allergies  History  Substance Use Topics  . Smoking status: Light Tobacco Smoker -- 0.25 packs/day for 7 years    Types: Cigarettes  . Smokeless tobacco: Never Used  . Alcohol Use: No    Family History  Problem Relation Age of Onset  . Hypertension Father   . Heart disease Father   . Kidney disease Father   . Hearing loss Neg Hx      Review of Systems Constitutional: negative for weight loss Gastrointestinal: + for vomiting Genitourinary:negative for genital lesions and vaginal discharge and dysuria Musculoskeletal:negative for back pain, + left hip pain Behavioral/Psych: negative for abusive relationship, depression, illegal drug usage.  + tobacco use    Objective:    BP 112/64 mmHg  Pulse 89  Temp(Src) 98.8 F (37.1 C)  Wt 122  lb (55.339 kg)  LMP 05/29/2014 (Approximate) General Appearance:    Alert, cooperative, no distress, appears stated age  Head:    Normocephalic, without obvious abnormality, atraumatic  Eyes:    PERRL, conjunctiva/corneas clear, EOM's intact, fundi    benign, both eyes  Ears:    Normal TM's and external ear canals, both ears  Nose:   Nares normal, septum midline, mucosa normal, no drainage    or sinus tenderness  Throat:   Lips, mucosa, and tongue normal; teeth and gums normal  Neck:   Supple, symmetrical, trachea midline, no adenopathy;    thyroid:  no enlargement/tenderness/nodules; no carotid   bruit or JVD  Back:     Symmetric, no curvature, ROM normal, no CVA tenderness  Lungs:     Clear to auscultation bilaterally, respirations unlabored  Chest Wall:    No tenderness or deformity   Heart:    Regular rate and rhythm, S1 and S2 normal, no murmur, rub   or gallop  Breast Exam:    No tenderness, masses, or nipple abnormality  Abdomen:     Soft, non-tender, bowel sounds active all four quadrants,    no masses, no organomegaly  Genitalia:    Normal female without lesion, discharge or tenderness  Extremities:   Extremities normal, atraumatic, no cyanosis or edema  Pulses:   2+ and symmetric all extremities  Skin:  Skin color, texture, turgor normal, no rashes or lesions  Lymph nodes:   Cervical, supraclavicular, and axillary nodes normal  Neurologic:   CNII-XII intact, normal strength, sensation and reflexes    throughout      Lab Review Urine pregnancy test Labs reviewed yes Radiologic studies reviewed no Assessment:    Pregnancy at [redacted]w[redacted]d weeks   Smoking cessation Left hip pain N&V in pregnancy  Plan:      Prenatal vitamins.  Counseling provided regarding continued use of seat belts, cessation of alcohol consumption, smoking or use of illicit drugs; infection precautions i.e., influenza/TDAP immunizations, toxoplasmosis,CMV, parvovirus, listeria and varicella; workplace  safety, exercise during pregnancy; routine dental care, safe medications, sexual activity, hot tubs, saunas, pools, travel, caffeine use, fish and methlymercury, potential toxins, hair treatments, varicose veins Weight gain recommendations per IOM guidelines reviewed: underweight/BMI< 18.5--> gain 28 - 40 lbs; normal weight/BMI 18.5 - 24.9--> gain 25 - 35 lbs; overweight/BMI 25 - 29.9--> gain 15 - 25 lbs; obese/BMI >30->gain  11 - 20 lbs Problem list reviewed and updated. FIRST/CF mutation testing/NIPT/QUAD SCREEN/fragile X/Ashkenazi Jewish population testing/Spinal muscular atrophy discussed: requested. Role of ultrasound in pregnancy discussed; fetal survey: requested. Amniocentesis discussed: not indicated.   Meds ordered this encounter  Medications  . prenatal vitamin w/FE, FA (PRENATAL 1 + 1) 27-1 MG TABS tablet    Sig: Take 1 tablet by mouth daily at 12 noon.   Orders Placed This Encounter  Procedures  . US OB Limited    Standing Status: Future     Number of Occurrences:      Standing Expiration Date: 11/06/2015    Order Specific Question:  Reason for Exam (SYMPTOM  OR DIAGNOSIS REQUIRED)    Answer:  for dating, unsure of LMP    Order Specific Question:  Preferred imaging location?    Answer:  Internal    Follow up in 4 weeks. 50% of 30 min visit spent on counseling and coordination of care.

## 2014-09-07 ENCOUNTER — Other Ambulatory Visit: Payer: Self-pay | Admitting: Certified Nurse Midwife

## 2014-09-07 LAB — PAP IG W/ RFLX HPV ASCU

## 2014-09-07 LAB — TSH: TSH: 0.368 u[IU]/mL (ref 0.350–4.500)

## 2014-09-07 LAB — VITAMIN D 25 HYDROXY (VIT D DEFICIENCY, FRACTURES): Vit D, 25-Hydroxy: 11 ng/mL — ABNORMAL LOW (ref 30–100)

## 2014-09-07 LAB — HIV ANTIBODY (ROUTINE TESTING W REFLEX): HIV 1&2 Ab, 4th Generation: NONREACTIVE

## 2014-09-07 LAB — VARICELLA ZOSTER ANTIBODY, IGG: VARICELLA IGG: 1679 {index} — AB (ref <135.00)

## 2014-09-08 LAB — CULTURE, OB URINE
Colony Count: NO GROWTH
Organism ID, Bacteria: NO GROWTH

## 2014-09-10 LAB — OBSTETRIC PANEL
Antibody Screen: NEGATIVE
Basophils Absolute: 0 10*3/uL (ref 0.0–0.1)
Basophils Relative: 0 % (ref 0–1)
EOS ABS: 0.3 10*3/uL (ref 0.0–0.7)
Eosinophils Relative: 4 % (ref 0–5)
HEMATOCRIT: 35.2 % — AB (ref 36.0–46.0)
Hemoglobin: 11.5 g/dL — ABNORMAL LOW (ref 12.0–15.0)
Hepatitis B Surface Ag: NEGATIVE
Lymphocytes Relative: 28 % (ref 12–46)
Lymphs Abs: 2.2 10*3/uL (ref 0.7–4.0)
MCH: 29.3 pg (ref 26.0–34.0)
MCHC: 32.7 g/dL (ref 30.0–36.0)
MCV: 89.6 fL (ref 78.0–100.0)
MONOS PCT: 8 % (ref 3–12)
MPV: 9.8 fL (ref 8.6–12.4)
Monocytes Absolute: 0.6 10*3/uL (ref 0.1–1.0)
Neutro Abs: 4.7 10*3/uL (ref 1.7–7.7)
Neutrophils Relative %: 60 % (ref 43–77)
Platelets: 250 10*3/uL (ref 150–400)
RBC: 3.93 MIL/uL (ref 3.87–5.11)
RDW: 14.2 % (ref 11.5–15.5)
RH TYPE: POSITIVE
Rubella: 6.99 Index — ABNORMAL HIGH (ref <0.90)
WBC: 7.8 10*3/uL (ref 4.0–10.5)

## 2014-09-10 LAB — HEMOGLOBINOPATHY EVALUATION
HGB F QUANT: 0 % (ref 0.0–2.0)
HGB S QUANTITAION: 0 %
Hemoglobin Other: 0 %
Hgb A2 Quant: 2.9 % (ref 2.2–3.2)
Hgb A: 97.1 % (ref 96.8–97.8)

## 2014-09-11 ENCOUNTER — Other Ambulatory Visit: Payer: Self-pay | Admitting: Certified Nurse Midwife

## 2014-09-11 ENCOUNTER — Ambulatory Visit (HOSPITAL_COMMUNITY)
Admission: RE | Admit: 2014-09-11 | Discharge: 2014-09-11 | Disposition: A | Discharge status: Home / Self Care | Payer: Medicaid Other | Source: Ambulatory Visit | Attending: Certified Nurse Midwife | Admitting: Certified Nurse Midwife

## 2014-09-11 DIAGNOSIS — Z3687 Encounter for antenatal screening for uncertain dates: Secondary | ICD-10-CM

## 2014-09-11 DIAGNOSIS — Z3A14 14 weeks gestation of pregnancy: Secondary | ICD-10-CM | POA: Diagnosis not present

## 2014-09-11 DIAGNOSIS — Z36 Encounter for antenatal screening of mother: Secondary | ICD-10-CM | POA: Diagnosis present

## 2014-09-12 ENCOUNTER — Other Ambulatory Visit: Payer: Self-pay | Admitting: Certified Nurse Midwife

## 2014-09-12 LAB — SURESWAB, VAGINOSIS/VAGINITIS PLUS
Atopobium vaginae: 5.1 Log (cells/mL)
C. TROPICALIS, DNA: NOT DETECTED
C. albicans, DNA: NOT DETECTED
C. glabrata, DNA: NOT DETECTED
C. parapsilosis, DNA: NOT DETECTED
C. trachomatis RNA, TMA: NOT DETECTED
LACTOBACILLUS SPECIES: NOT DETECTED Log (cells/mL)
N. gonorrhoeae RNA, TMA: NOT DETECTED
T. VAGINALIS RNA, QL TMA: NOT DETECTED

## 2014-09-18 ENCOUNTER — Other Ambulatory Visit: Payer: Self-pay | Admitting: Certified Nurse Midwife

## 2014-09-19 ENCOUNTER — Other Ambulatory Visit: Payer: Self-pay | Admitting: Certified Nurse Midwife

## 2014-09-19 ENCOUNTER — Telehealth: Payer: Self-pay | Admitting: Obstetrics

## 2014-09-19 DIAGNOSIS — R52 Pain, unspecified: Secondary | ICD-10-CM

## 2014-09-19 DIAGNOSIS — G43719 Chronic migraine without aura, intractable, without status migrainosus: Secondary | ICD-10-CM

## 2014-09-19 MED ORDER — ACETAMINOPHEN 325 MG PO TABS: 650.0000 mg | ORAL_TABLET | Freq: Four times a day (QID) | ORAL | Status: DC | PRN

## 2014-09-19 MED ORDER — BUTALBITAL-APAP-CAFFEINE 50-325-40 MG PO TABS: 1.0000 | ORAL_TABLET | Freq: Four times a day (QID) | ORAL | Status: DC | PRN

## 2014-09-21 NOTE — Telephone Encounter (Signed)
09/20/2014 - Patient picked up RX. brm

## 2014-10-02 ENCOUNTER — Encounter: Payer: Medicaid Other | Admitting: Obstetrics

## 2014-10-04 ENCOUNTER — Encounter: Payer: Medicaid Other | Admitting: Obstetrics

## 2014-10-08 ENCOUNTER — Encounter: Payer: Self-pay | Admitting: Obstetrics

## 2014-10-08 ENCOUNTER — Ambulatory Visit (INDEPENDENT_AMBULATORY_CARE_PROVIDER_SITE_OTHER): Payer: Medicaid Other | Admitting: Obstetrics

## 2014-10-08 VITALS — BP 123/67 | HR 87 | Temp 98.5°F | Wt 130.0 lb

## 2014-10-08 DIAGNOSIS — Z3482 Encounter for supervision of other normal pregnancy, second trimester: Secondary | ICD-10-CM

## 2014-10-08 LAB — POCT URINALYSIS DIPSTICK
Bilirubin, UA: NEGATIVE
Blood, UA: NEGATIVE
Glucose, UA: NEGATIVE
Ketones, UA: NEGATIVE
NITRITE UA: NEGATIVE
PH UA: 7
PROTEIN UA: NEGATIVE
SPEC GRAV UA: 1.015
Urobilinogen, UA: NEGATIVE

## 2014-10-08 MED ORDER — PRENATE MINI 18-0.6-0.4-350 MG PO CAPS: 1.0000 | ORAL_CAPSULE | Freq: Every day | ORAL | Status: DC

## 2014-10-08 NOTE — Progress Notes (Signed)
Subjective:    [name] is a 29 y.o. female being seen today for her obstetrical visit. She is at [redacted]w[redacted]d gestation. Patient reports: no complaints . Fetal movement: normal.  Problem List Items Addressed This Visit    None    Visit Diagnoses    Encounter for supervision of other normal pregnancy in second trimester    -  Primary    Relevant Medications    Prenat-FeCbn-FeAsp-Meth-FA-DHA (PRENATE MINI) 18-0.6-0.4-350 MG CAPS    Other Relevant Orders    POCT urinalysis dipstick (Completed)    US OB Comp + 14 Wk      Patient Active Problem List   Diagnosis Date Noted  . Migraine 08/22/2012   Objective:    BP 123/67 mmHg  Pulse 87  Temp(Src) 98.5 F (36.9 C)  Wt 130 lb (58.968 kg)  LMP 05/29/2014 (Approximate) FHT: 150 BPM  Uterine Size: size equals dates     Assessment:    Pregnancy @ [redacted]w[redacted]d    Plan:    OBGCT: ordered.  Labs, problem list reviewed and updated 2 hr GTT planned Follow up in 4 weeks.

## 2014-10-18 ENCOUNTER — Ambulatory Visit (INDEPENDENT_AMBULATORY_CARE_PROVIDER_SITE_OTHER): Payer: Medicaid Other

## 2014-10-18 DIAGNOSIS — Z3482 Encounter for supervision of other normal pregnancy, second trimester: Secondary | ICD-10-CM

## 2014-10-18 LAB — US OB COMP + 14 WK

## 2014-11-05 ENCOUNTER — Encounter: Payer: Medicaid Other | Admitting: Obstetrics

## 2014-11-08 ENCOUNTER — Encounter: Payer: Self-pay | Admitting: Obstetrics

## 2014-11-08 ENCOUNTER — Ambulatory Visit (INDEPENDENT_AMBULATORY_CARE_PROVIDER_SITE_OTHER): Payer: Medicaid Other | Admitting: Obstetrics

## 2014-11-08 VITALS — BP 115/69 | HR 74 | Temp 98.4°F | Wt 133.0 lb

## 2014-11-08 DIAGNOSIS — Z3482 Encounter for supervision of other normal pregnancy, second trimester: Secondary | ICD-10-CM

## 2014-11-08 LAB — POCT URINALYSIS DIPSTICK
Bilirubin, UA: NEGATIVE
Blood, UA: NEGATIVE
Glucose, UA: NORMAL
Ketones, UA: NEGATIVE
LEUKOCYTES UA: NEGATIVE
NITRITE UA: NEGATIVE
Protein, UA: NEGATIVE
Spec Grav, UA: <=1.005
Urobilinogen, UA: NEGATIVE
pH, UA: 8

## 2014-11-08 NOTE — Addendum Note (Signed)
Addended by: Clearnce Hasten on: 11/08/2014 05:09 PM   Modules accepted: Orders

## 2014-11-08 NOTE — Progress Notes (Signed)
Subjective:    [name] is a 29 y.o. female being seen today for her obstetrical visit. She is at [redacted]w[redacted]d gestation. Patient reports: heartburn . Fetal movement: normal.  Problem List Items Addressed This Visit    None     Patient Active Problem List   Diagnosis Date Noted  . Migraine 08/22/2012   Objective:    BP 115/69 mmHg  Pulse 74  Temp(Src) 98.4 F (36.9 C)  Wt 133 lb (60.328 kg)  LMP 05/29/2014 (Approximate) FHT: 150 BPM  Uterine Size: size equals dates     Assessment:    Pregnancy @ [redacted]w[redacted]d    Plan:    OBGCT: ordered for next visit.  Labs, problem list reviewed and updated 2 hr GTT planned Follow up in 4 weeks.

## 2014-12-04 ENCOUNTER — Encounter: Payer: Self-pay | Admitting: Obstetrics

## 2014-12-04 ENCOUNTER — Ambulatory Visit (INDEPENDENT_AMBULATORY_CARE_PROVIDER_SITE_OTHER): Payer: Medicaid Other | Admitting: Obstetrics

## 2014-12-04 ENCOUNTER — Other Ambulatory Visit: Payer: Medicaid Other

## 2014-12-04 VITALS — BP 103/59 | HR 79 | Wt 136.0 lb

## 2014-12-04 DIAGNOSIS — Z3483 Encounter for supervision of other normal pregnancy, third trimester: Secondary | ICD-10-CM

## 2014-12-04 LAB — POCT URINALYSIS DIPSTICK
Bilirubin, UA: NEGATIVE
Blood, UA: NEGATIVE
GLUCOSE UA: NEGATIVE
KETONES UA: NEGATIVE
Nitrite, UA: NEGATIVE
Protein, UA: NEGATIVE
Spec Grav, UA: 1.01
Urobilinogen, UA: NEGATIVE
pH, UA: 7

## 2014-12-04 LAB — CBC
HCT: 28.7 % — ABNORMAL LOW (ref 36.0–46.0)
Hemoglobin: 9.6 g/dL — ABNORMAL LOW (ref 12.0–15.0)
MCH: 30.7 pg (ref 26.0–34.0)
MCHC: 33.4 g/dL (ref 30.0–36.0)
MCV: 91.7 fL (ref 78.0–100.0)
MPV: 10.4 fL (ref 8.6–12.4)
PLATELETS: 229 10*3/uL (ref 150–400)
RBC: 3.13 MIL/uL — ABNORMAL LOW (ref 3.87–5.11)
RDW: 14.4 % (ref 11.5–15.5)
WBC: 14.3 10*3/uL — AB (ref 4.0–10.5)

## 2014-12-04 NOTE — Progress Notes (Signed)
Subjective:    [name] is a 29 y.o. female being seen today for her obstetrical visit. She is at [redacted]w[redacted]d gestation. Patient reports: no complaints . Fetal movement: normal.  Problem List Items Addressed This Visit    None    Visit Diagnoses    Encounter for supervision of other normal pregnancy in second trimester    -  Primary    Relevant Orders    POCT urinalysis dipstick (Completed)    Glucose Tolerance, 2 Hours w/1 Hour    CBC    HIV antibody    RPR      Patient Active Problem List   Diagnosis Date Noted  . Migraine 08/22/2012   Objective:    BP 103/59 mmHg  Pulse 79  Wt 136 lb (61.689 kg)  LMP 05/29/2014 (Approximate) FHT: 150 BPM  Uterine Size: size equals dates     Assessment:    Pregnancy @ [redacted]w[redacted]d    Plan:    OBGCT: ordered.  Labs, problem list reviewed and updated 2 hr GTT planned Follow up in 2 weeks.

## 2014-12-05 LAB — GLUCOSE TOLERANCE, 2 HOURS W/ 1HR
GLUCOSE, 2 HOUR: 89 mg/dL (ref 70–139)
GLUCOSE: 107 mg/dL (ref 70–170)
Glucose, Fasting: 70 mg/dL (ref 65–99)

## 2014-12-05 LAB — HIV ANTIBODY (ROUTINE TESTING W REFLEX): HIV: NONREACTIVE

## 2014-12-05 LAB — RPR

## 2014-12-17 ENCOUNTER — Encounter: Payer: Self-pay | Admitting: Obstetrics

## 2014-12-17 ENCOUNTER — Ambulatory Visit (INDEPENDENT_AMBULATORY_CARE_PROVIDER_SITE_OTHER): Payer: Self-pay | Admitting: Obstetrics

## 2014-12-17 VITALS — BP 99/60 | HR 81 | Temp 98.1°F | Wt 138.0 lb

## 2014-12-17 DIAGNOSIS — Z3493 Encounter for supervision of normal pregnancy, unspecified, third trimester: Secondary | ICD-10-CM

## 2014-12-17 LAB — POCT URINALYSIS DIPSTICK
BILIRUBIN UA: NEGATIVE
Glucose, UA: NEGATIVE
Ketones, UA: NEGATIVE
Leukocytes, UA: NEGATIVE
Nitrite, UA: NEGATIVE
PH UA: 8
Protein, UA: NEGATIVE
RBC UA: NEGATIVE
Spec Grav, UA: <=1.005
UROBILINOGEN UA: NEGATIVE

## 2014-12-17 NOTE — Progress Notes (Signed)
Subjective:    [name] is a 29 y.o. female being seen today for her obstetrical visit. She is at [redacted]w[redacted]d gestation. Patient reports no complaints. Fetal movement: normal.  Problem List Items Addressed This Visit    None    Visit Diagnoses    Prenatal care, third trimester    -  Primary    Relevant Orders    POCT urinalysis dipstick (Completed)      Patient Active Problem List   Diagnosis Date Noted  . Migraine 08/22/2012   Objective:    BP 99/60 mmHg  Pulse 81  Temp(Src) 98.1 F (36.7 C)  Wt 138 lb (62.596 kg)  LMP 05/29/2014 (Approximate) FHT:  150 BPM  Uterine Size: size equals dates  Presentation: unsure     Assessment:    Pregnancy @ [redacted]w[redacted]d weeks   Plan:     labs reviewed, problem list updated Consent signed. GBS sent TDAP offered  Rhogam given for RH negative Pediatrician: discussed. Infant feeding: plans to breastfeed. Maternity leave: discussed. Cigarette smoking: smokes 0.25 PPD. Orders Placed This Encounter  Procedures  . POCT urinalysis dipstick   No orders of the defined types were placed in this encounter.   Follow up in 2 Weeks.

## 2014-12-17 NOTE — Progress Notes (Signed)
Patient is doing weel- patient wants to discuss her C- section

## 2014-12-31 ENCOUNTER — Encounter: Payer: Medicaid Other | Admitting: Obstetrics

## 2015-01-07 ENCOUNTER — Ambulatory Visit (INDEPENDENT_AMBULATORY_CARE_PROVIDER_SITE_OTHER): Payer: Medicaid Other | Admitting: Obstetrics

## 2015-01-07 VITALS — BP 106/60 | HR 79 | Temp 98.2°F | Wt 141.0 lb

## 2015-01-07 DIAGNOSIS — Z1389 Encounter for screening for other disorder: Secondary | ICD-10-CM

## 2015-01-07 DIAGNOSIS — Z331 Pregnant state, incidental: Secondary | ICD-10-CM

## 2015-01-07 DIAGNOSIS — Z3483 Encounter for supervision of other normal pregnancy, third trimester: Secondary | ICD-10-CM

## 2015-01-08 ENCOUNTER — Encounter: Payer: Self-pay | Admitting: Obstetrics

## 2015-01-08 LAB — POCT URINALYSIS DIPSTICK
Bilirubin, UA: NEGATIVE
Glucose, UA: NEGATIVE
KETONES UA: NEGATIVE
LEUKOCYTES UA: NEGATIVE
Nitrite, UA: NEGATIVE
RBC UA: NEGATIVE
Urobilinogen, UA: NEGATIVE
pH, UA: 7

## 2015-01-08 NOTE — Progress Notes (Signed)
Subjective:    [name] is a 29 y.o. female being seen today for her obstetrical visit. She is at 1621w0d gestation. Patient reports no complaints. Fetal movement: normal.  Problem List Items Addressed This Visit    None    Visit Diagnoses    Supervision of other normal pregnancy, antepartum, third trimester    -  Primary    Relevant Orders    POCT urinalysis dipstick    SGA (small for gestational age)        Relevant Orders    US OB Follow Up    US MFM OB COMP + 14 WK      Patient Active Problem List   Diagnosis Date Noted  . Migraine 08/22/2012   Objective:    BP 106/60 mmHg  Pulse 79  Temp(Src) 98.2 F (36.8 C)  Wt 141 lb (63.957 kg)  LMP 05/29/2014 (Approximate) FHT:  150 BPM  Uterine Size: size equals dates  Presentation: unsure     Assessment:    Pregnancy @ 10721w0d weeks   Plan:     labs reviewed, problem list updated Consent signed. GBS sent TDAP offered  Rhogam given for RH negative Pediatrician: discussed. Infant feeding: plans to breastfeed. Maternity leave: discussed. Cigarette smoking: smokes 0.25 PPD. Orders Placed This Encounter  Procedures  . US OB Follow Up    Standing Status: Future     Number of Occurrences:      Standing Expiration Date: 03/08/2016    Order Specific Question:  Reason for Exam (SYMPTOM  OR DIAGNOSIS REQUIRED)    Answer:  size < dates    Order Specific Question:  Preferred imaging location?    Answer:  [facility]  . US MFM OB COMP + 14 WK    Standing Status: Future     Number of Occurrences:      Standing Expiration Date: 03/08/2016    Order Specific Question:  Reason for Exam (SYMPTOM  OR DIAGNOSIS REQUIRED)    Answer:  size < dates    Order Specific Question:  Preferred imaging location?    Answer:  [facility]  . POCT urinalysis dipstick   No orders of the defined types were placed in this encounter.   Follow up in 2 Weeks.

## 2015-01-14 ENCOUNTER — Other Ambulatory Visit: Payer: Self-pay | Admitting: Obstetrics

## 2015-01-14 ENCOUNTER — Ambulatory Visit (HOSPITAL_COMMUNITY)
Admission: RE | Admit: 2015-01-14 | Discharge: 2015-01-14 | Disposition: A | Discharge status: Home / Self Care | Payer: Medicaid Other | Source: Ambulatory Visit | Attending: Obstetrics | Admitting: Obstetrics

## 2015-01-14 ENCOUNTER — Encounter (HOSPITAL_COMMUNITY): Payer: Self-pay

## 2015-01-14 VITALS — BP 108/48 | HR 77 | Wt 141.4 lb

## 2015-01-14 DIAGNOSIS — Z3A32 32 weeks gestation of pregnancy: Secondary | ICD-10-CM | POA: Insufficient documentation

## 2015-01-14 DIAGNOSIS — O26843 Uterine size-date discrepancy, third trimester: Secondary | ICD-10-CM | POA: Insufficient documentation

## 2015-01-14 DIAGNOSIS — Z36 Encounter for antenatal screening of mother: Secondary | ICD-10-CM | POA: Diagnosis present

## 2015-01-14 DIAGNOSIS — IMO0002 Reserved for concepts with insufficient information to code with codable children: Secondary | ICD-10-CM

## 2015-01-15 ENCOUNTER — Other Ambulatory Visit (HOSPITAL_COMMUNITY): Payer: Self-pay | Admitting: *Deleted

## 2015-01-15 DIAGNOSIS — O26849 Uterine size-date discrepancy, unspecified trimester: Secondary | ICD-10-CM

## 2015-01-21 ENCOUNTER — Ambulatory Visit (INDEPENDENT_AMBULATORY_CARE_PROVIDER_SITE_OTHER): Payer: Medicaid Other | Admitting: Obstetrics

## 2015-01-21 VITALS — BP 111/59 | HR 77 | Temp 98.2°F | Wt 142.0 lb

## 2015-01-21 DIAGNOSIS — O360131 Maternal care for anti-D [Rh] antibodies, third trimester, fetus 1: Secondary | ICD-10-CM | POA: Diagnosis not present

## 2015-01-21 DIAGNOSIS — Z3483 Encounter for supervision of other normal pregnancy, third trimester: Secondary | ICD-10-CM

## 2015-01-21 MED ORDER — RHO D IMMUNE GLOBULIN 1500 UNIT/2ML IJ SOSY
300.0000 ug | PREFILLED_SYRINGE | Freq: Once | INTRAMUSCULAR | Status: AC
  Administered 2015-01-21: 300 ug via INTRAMUSCULAR

## 2015-01-22 ENCOUNTER — Encounter: Payer: Self-pay | Admitting: Obstetrics

## 2015-01-22 ENCOUNTER — Ambulatory Visit (HOSPITAL_COMMUNITY): Payer: Medicaid Other | Attending: Obstetrics

## 2015-01-22 LAB — ABO AND RH: Rh Type: NEGATIVE

## 2015-01-22 NOTE — Progress Notes (Signed)
Subjective:    [name] is a 29 y.o. female being seen today for her obstetrical visit. She is at 3030w0d gestation. Patient reports no complaints. Fetal movement: normal.  Problem List Items Addressed This Visit    None    Visit Diagnoses    Supervision of other normal pregnancy, antepartum, third trimester    -  Primary    Relevant Orders    POCT urinalysis dipstick    Rh negative state in antepartum period, third trimester, fetus 1        Relevant Medications    rho (d) immune globulin (RHIG/RHOPHYLAC) injection 300 mcg (Completed)    Other Relevant Orders    ABO AND RH       Patient Active Problem List   Diagnosis Date Noted  . Migraine 08/22/2012   Objective:    BP 111/59 mmHg  Pulse 77  Temp(Src) 98.2 F (36.8 C)  Wt 142 lb (64.411 kg)  LMP 05/29/2014 (Approximate) FHT:  150 BPM  Uterine Size: size equals dates  Presentation: unsure     Assessment:    Pregnancy @ 430w0d weeks   Plan:     labs reviewed, problem list updated Consent signed. GBS sent TDAP offered  Rhogam given for RH negative Pediatrician: discussed. Infant feeding: plans to breastfeed. Maternity leave: discussed. Cigarette smoking: smokes 0.25 PPD. Orders Placed This Encounter  Procedures  . ABO AND RH   . POCT urinalysis dipstick   Meds ordered this encounter  Medications  . rho (d) immune globulin (RHIG/RHOPHYLAC) injection 300 mcg    Sig:    Follow up in 2 Weeks.

## 2015-01-29 ENCOUNTER — Ambulatory Visit (HOSPITAL_COMMUNITY)
Admission: RE | Admit: 2015-01-29 | Discharge: 2015-01-29 | Disposition: A | Discharge status: Home / Self Care | Payer: Medicaid Other | Source: Ambulatory Visit | Attending: Obstetrics | Admitting: Obstetrics

## 2015-01-29 ENCOUNTER — Encounter (HOSPITAL_COMMUNITY): Payer: Self-pay

## 2015-01-29 DIAGNOSIS — O26849 Uterine size-date discrepancy, unspecified trimester: Secondary | ICD-10-CM

## 2015-01-29 DIAGNOSIS — O365930 Maternal care for other known or suspected poor fetal growth, third trimester, not applicable or unspecified: Secondary | ICD-10-CM | POA: Insufficient documentation

## 2015-01-29 DIAGNOSIS — Z3A35 35 weeks gestation of pregnancy: Secondary | ICD-10-CM | POA: Diagnosis not present

## 2015-02-04 ENCOUNTER — Encounter: Payer: Medicaid Other | Admitting: Obstetrics

## 2015-02-05 ENCOUNTER — Ambulatory Visit (HOSPITAL_COMMUNITY): Payer: Medicaid Other

## 2015-02-12 ENCOUNTER — Encounter (HOSPITAL_COMMUNITY): Payer: Self-pay

## 2015-02-12 ENCOUNTER — Other Ambulatory Visit: Payer: Self-pay | Admitting: Certified Nurse Midwife

## 2015-02-12 ENCOUNTER — Other Ambulatory Visit (HOSPITAL_COMMUNITY): Payer: Self-pay | Admitting: Maternal and Fetal Medicine

## 2015-02-12 ENCOUNTER — Ambulatory Visit (HOSPITAL_COMMUNITY)
Admission: RE | Admit: 2015-02-12 | Discharge: 2015-02-12 | Disposition: A | Discharge status: Home / Self Care | Payer: Medicaid Other | Source: Ambulatory Visit | Attending: Obstetrics | Admitting: Obstetrics

## 2015-02-12 DIAGNOSIS — Z3A36 36 weeks gestation of pregnancy: Secondary | ICD-10-CM | POA: Diagnosis not present

## 2015-02-12 DIAGNOSIS — Z3A37 37 weeks gestation of pregnancy: Secondary | ICD-10-CM

## 2015-02-12 DIAGNOSIS — O365930 Maternal care for other known or suspected poor fetal growth, third trimester, not applicable or unspecified: Secondary | ICD-10-CM

## 2015-02-12 DIAGNOSIS — O26849 Uterine size-date discrepancy, unspecified trimester: Secondary | ICD-10-CM

## 2015-02-14 ENCOUNTER — Encounter: Payer: Self-pay | Admitting: Obstetrics

## 2015-02-14 ENCOUNTER — Ambulatory Visit (INDEPENDENT_AMBULATORY_CARE_PROVIDER_SITE_OTHER): Payer: Medicaid Other | Admitting: Obstetrics

## 2015-02-14 VITALS — BP 131/72 | HR 80 | Temp 98.6°F | Wt 145.0 lb

## 2015-02-14 DIAGNOSIS — Z3483 Encounter for supervision of other normal pregnancy, third trimester: Secondary | ICD-10-CM

## 2015-02-14 DIAGNOSIS — M25552 Pain in left hip: Secondary | ICD-10-CM

## 2015-02-14 LAB — POCT URINALYSIS DIPSTICK
BILIRUBIN UA: NEGATIVE
GLUCOSE UA: NEGATIVE
Ketones, UA: NEGATIVE
Leukocytes, UA: NEGATIVE
NITRITE UA: NEGATIVE
Protein, UA: NEGATIVE
RBC UA: NEGATIVE
Spec Grav, UA: <=1.005
UROBILINOGEN UA: NEGATIVE
pH, UA: 7

## 2015-02-14 NOTE — Progress Notes (Signed)
Subjective:    [name] is a 29 y.o. female being seen today for her obstetrical visit. She is at 431w2d gestation. Patient reports backache and hip pain. Fetal movement: normal.  Problem List Items Addressed This Visit    None    Visit Diagnoses    Encounter for supervision of other normal pregnancy in third trimester    -  Primary    Relevant Orders    POCT urinalysis dipstick (Completed)    Strep B DNA probe      Patient Active Problem List   Diagnosis Date Noted  . Migraine 08/22/2012   Objective:    BP 131/72 mmHg  Pulse 80  Temp(Src) 98.6 F (37 C)  Wt 145 lb (65.772 kg)  LMP 05/29/2014 (Approximate) FHT:  150 BPM  Uterine Size: size equals dates  Presentation: unsure     Assessment:    Pregnancy @ 481w2d weeks    Hip pain  Plan:    Maternity belt Rx   labs reviewed, problem list updated Consent signed. GBS sent TDAP offered  Rhogam given for RH negative Pediatrician: discussed. Infant feeding: plans to breastfeed. Maternity leave: discussed. Cigarette smoking: smokes 0.25 PPD. Orders Placed This Encounter  Procedures  . Strep B DNA probe  . POCT urinalysis dipstick   No orders of the defined types were placed in this encounter.   Follow up in 1 Week.

## 2015-02-14 NOTE — Progress Notes (Signed)
Pt is still having severe hip pain.  Pt states that she is still taking Gabapentin but not much relief.

## 2015-02-15 LAB — STREP B DNA PROBE: STREP GROUP B AG: DETECTED

## 2015-02-20 ENCOUNTER — Ambulatory Visit (HOSPITAL_COMMUNITY): Payer: Medicaid Other | Attending: Obstetrics

## 2015-02-25 ENCOUNTER — Ambulatory Visit (INDEPENDENT_AMBULATORY_CARE_PROVIDER_SITE_OTHER): Payer: Medicaid Other | Admitting: Obstetrics

## 2015-02-25 VITALS — BP 121/65 | HR 76 | Temp 98.2°F | Wt 146.0 lb

## 2015-02-25 DIAGNOSIS — Z3483 Encounter for supervision of other normal pregnancy, third trimester: Secondary | ICD-10-CM

## 2015-02-25 LAB — POCT URINALYSIS DIPSTICK
Bilirubin, UA: NEGATIVE
Blood, UA: NEGATIVE
Glucose, UA: NEGATIVE
Ketones, UA: NEGATIVE
Leukocytes, UA: NEGATIVE
Nitrite, UA: NEGATIVE
Protein, UA: NEGATIVE
Spec Grav, UA: 1.01
Urobilinogen, UA: NEGATIVE
pH, UA: 7

## 2015-02-26 ENCOUNTER — Encounter: Payer: Self-pay | Admitting: Obstetrics

## 2015-02-26 NOTE — Progress Notes (Signed)
Subjective:    Jerelyn Scottbony S Fadeley is a 29 y.o. female being seen today for her obstetrical visit. She is at 792w0d gestation. Patient reports no complaints. Fetal movement: normal.  Problem List Items Addressed This Visit    None    Visit Diagnoses    Supervision of other normal pregnancy, antepartum, third trimester    -  Primary    Relevant Orders    POCT urinalysis dipstick (Completed)      Patient Active Problem List   Diagnosis Date Noted  . Migraine 08/22/2012    Objective:    BP 121/65 mmHg  Pulse 76  Temp(Src) 98.2 F (36.8 C)  Wt 146 lb (66.225 kg)  LMP 05/29/2014 (Approximate) FHT: 150 BPM  Uterine Size: size equals dates  Presentations: unsure    Assessment:    Pregnancy @ 462w0d weeks   Plan:   Plans for delivery: Vaginal anticipated; labs reviewed; problem list updated Counseling: Consent signed. Infant feeding: plans to breastfeed. Cigarette smoking: 0.25 ppd L&D discussion: symptoms of labor, discussed when to call, discussed what number to call, anesthetic/analgesic options reviewed and delivering clinician:  plans no preference. Postpartum supports and preparation: circumcision discussed and contraception plans discussed.  Follow up in 1 Week.

## 2015-02-27 ENCOUNTER — Ambulatory Visit (HOSPITAL_COMMUNITY): Admission: RE | Admit: 2015-02-27 | Payer: Medicaid Other | Source: Ambulatory Visit

## 2015-02-27 ENCOUNTER — Other Ambulatory Visit (HOSPITAL_COMMUNITY): Payer: Self-pay | Admitting: Maternal and Fetal Medicine

## 2015-02-27 DIAGNOSIS — O26849 Uterine size-date discrepancy, unspecified trimester: Secondary | ICD-10-CM

## 2015-02-27 DIAGNOSIS — Z3A38 38 weeks gestation of pregnancy: Secondary | ICD-10-CM

## 2015-03-04 ENCOUNTER — Ambulatory Visit (INDEPENDENT_AMBULATORY_CARE_PROVIDER_SITE_OTHER): Payer: Medicaid Other | Admitting: Obstetrics

## 2015-03-04 ENCOUNTER — Encounter: Payer: Self-pay | Admitting: Obstetrics

## 2015-03-04 ENCOUNTER — Other Ambulatory Visit: Payer: Self-pay | Admitting: *Deleted

## 2015-03-04 VITALS — BP 107/67 | HR 83 | Temp 98.1°F | Wt 144.0 lb

## 2015-03-04 DIAGNOSIS — Z3483 Encounter for supervision of other normal pregnancy, third trimester: Secondary | ICD-10-CM

## 2015-03-04 LAB — POCT URINALYSIS DIPSTICK
Bilirubin, UA: NEGATIVE
Glucose, UA: NEGATIVE
Ketones, UA: NEGATIVE
Leukocytes, UA: NEGATIVE
NITRITE UA: NEGATIVE
PH UA: 7
Protein, UA: NEGATIVE
RBC UA: NEGATIVE
Spec Grav, UA: <=1.005
UROBILINOGEN UA: NEGATIVE

## 2015-03-04 NOTE — Progress Notes (Signed)
Pt is having some increase in pressure and hip pain.

## 2015-03-04 NOTE — Progress Notes (Signed)
Subjective:    [name] is a 29 y.o. female being seen today for her obstetrical visit. She is at 1848w6d gestation. Patient reports no complaints. Fetal movement: normal.  Problem List Items Addressed This Visit    None    Visit Diagnoses    Encounter for supervision of other normal pregnancy in third trimester    -  Primary    Relevant Orders    POCT urinalysis dipstick (Completed)      Patient Active Problem List   Diagnosis Date Noted  . Migraine 08/22/2012    Objective:    BP 107/67 mmHg  Pulse 83  Temp(Src) 98.1 F (36.7 C)  Wt 144 lb (65.318 kg)  LMP 05/29/2014 (Approximate) FHT: 150 BPM  Uterine Size: size equals dates  Presentations: unsure    Assessment:    Pregnancy @ 3948w6d weeks   Plan:   Plans for delivery: C/Section scheduled; labs reviewed; problem list updated Counseling: Consent signed. Infant feeding: plans to breastfeed. Cigarette smoking: smokes 0.25 PPD. L&D discussion: symptoms of labor, discussed when to call, discussed what number to call, anesthetic/analgesic options reviewed and delivering clinician:  plans Physician. Postpartum supports and preparation: circumcision discussed and contraception plans discussed.  Follow up in postpartum.

## 2015-03-05 ENCOUNTER — Encounter (HOSPITAL_COMMUNITY)
Admission: RE | Admit: 2015-03-05 | Discharge: 2015-03-05 | Disposition: A | Discharge status: Home / Self Care | Payer: Medicaid Other | Source: Ambulatory Visit | Attending: Obstetrics | Admitting: Obstetrics

## 2015-03-05 ENCOUNTER — Encounter (HOSPITAL_COMMUNITY): Payer: Self-pay

## 2015-03-05 ENCOUNTER — Ambulatory Visit (HOSPITAL_COMMUNITY): Payer: Medicaid Other

## 2015-03-05 LAB — CBC
HCT: 28.8 % — ABNORMAL LOW (ref 36.0–46.0)
HEMOGLOBIN: 9.7 g/dL — AB (ref 12.0–15.0)
MCH: 30.2 pg (ref 26.0–34.0)
MCHC: 33.7 g/dL (ref 30.0–36.0)
MCV: 89.7 fL (ref 78.0–100.0)
Platelets: 246 10*3/uL (ref 150–400)
RBC: 3.21 MIL/uL — ABNORMAL LOW (ref 3.87–5.11)
RDW: 13.8 % (ref 11.5–15.5)
WBC: 14.5 10*3/uL — ABNORMAL HIGH (ref 4.0–10.5)

## 2015-03-05 NOTE — Patient Instructions (Signed)
Your procedure is scheduled on: March 06, 2015  Enter through the Main Entrance of [facility] at: 9:45 am   Pick up the phone at the desk and dial [phone].  Call this number if you have problems the morning of surgery: [phone].  Remember: Do NOT eat food: after midnight tonight  Do NOT drink clear liquids after: midnight tonight  Take these medicines the morning of surgery with a SIP OF WATER: none   Do NOT wear jewelry (body piercing), metal hair clips/bobby pins, or nail polish. Do NOT wear lotions, powders, or perfumes.  You may wear deoderant. Do NOT shave for 48 hours prior to surgery. Do NOT bring valuables to the hospital. Leave suitcase in car.  After surgery it may be brought to your room.  For patients admitted to the hospital, checkout time is 11:00 AM the day of discharge.

## 2015-03-06 ENCOUNTER — Inpatient Hospital Stay (HOSPITAL_COMMUNITY): Payer: Medicaid Other | Admitting: Anesthesiology

## 2015-03-06 ENCOUNTER — Encounter (HOSPITAL_COMMUNITY)
Admission: RE | Disposition: A | Discharge status: Home / Self Care | Payer: Self-pay | Source: Ambulatory Visit | Attending: Obstetrics

## 2015-03-06 ENCOUNTER — Encounter (HOSPITAL_COMMUNITY): Payer: Self-pay | Admitting: Registered Nurse

## 2015-03-06 ENCOUNTER — Inpatient Hospital Stay (HOSPITAL_COMMUNITY)
Admission: RE | Admit: 2015-03-06 | Discharge: 2015-03-08 | DRG: 766 | Disposition: A | Discharge status: Home / Self Care | Payer: Medicaid Other | Source: Ambulatory Visit | Attending: Obstetrics | Admitting: Obstetrics

## 2015-03-06 DIAGNOSIS — F17210 Nicotine dependence, cigarettes, uncomplicated: Secondary | ICD-10-CM | POA: Diagnosis present

## 2015-03-06 DIAGNOSIS — O9902 Anemia complicating childbirth: Secondary | ICD-10-CM | POA: Diagnosis present

## 2015-03-06 DIAGNOSIS — O26893 Other specified pregnancy related conditions, third trimester: Secondary | ICD-10-CM | POA: Diagnosis present

## 2015-03-06 DIAGNOSIS — Z98891 History of uterine scar from previous surgery: Secondary | ICD-10-CM

## 2015-03-06 DIAGNOSIS — O99334 Smoking (tobacco) complicating childbirth: Secondary | ICD-10-CM | POA: Diagnosis present

## 2015-03-06 DIAGNOSIS — Z8249 Family history of ischemic heart disease and other diseases of the circulatory system: Secondary | ICD-10-CM | POA: Diagnosis not present

## 2015-03-06 DIAGNOSIS — D509 Iron deficiency anemia, unspecified: Secondary | ICD-10-CM | POA: Diagnosis present

## 2015-03-06 DIAGNOSIS — Z3A39 39 weeks gestation of pregnancy: Secondary | ICD-10-CM | POA: Diagnosis not present

## 2015-03-06 LAB — RPR: RPR: NONREACTIVE

## 2015-03-06 SURGERY — Surgical Case
Anesthesia: Spinal

## 2015-03-06 MED ORDER — WITCH HAZEL-GLYCERIN EX PADS: 1.0000 "application " | MEDICATED_PAD | CUTANEOUS | Status: DC | PRN

## 2015-03-06 MED ORDER — PNEUMOCOCCAL VAC POLYVALENT 25 MCG/0.5ML IJ INJ
0.5000 mL | INJECTION | INTRAMUSCULAR | Status: AC
  Administered 2015-03-08: 0.5 mL via INTRAMUSCULAR
  Filled 2015-03-06 (×3): qty 0.5

## 2015-03-06 MED ORDER — MORPHINE SULFATE (PF) 0.5 MG/ML IJ SOLN
INTRAMUSCULAR | Status: AC
  Filled 2015-03-06: qty 10

## 2015-03-06 MED ORDER — TETANUS-DIPHTH-ACELL PERTUSSIS 5-2.5-18.5 LF-MCG/0.5 IM SUSP
0.5000 mL | Freq: Once | INTRAMUSCULAR | Status: AC
  Administered 2015-03-07: 0.5 mL via INTRAMUSCULAR
  Filled 2015-03-06: qty 0.5

## 2015-03-06 MED ORDER — OXYTOCIN 40 UNITS IN LACTATED RINGERS INFUSION - SIMPLE MED
62.5000 mL/h | INTRAVENOUS | Status: AC
  Administered 2015-03-06: 62.5 mL/h via INTRAVENOUS
  Filled 2015-03-06: qty 1000

## 2015-03-06 MED ORDER — NALBUPHINE HCL 10 MG/ML IJ SOLN: 5.0000 mg | INTRAMUSCULAR | Status: DC | PRN

## 2015-03-06 MED ORDER — PHENYLEPHRINE HCL 10 MG/ML IJ SOLN: INTRAMUSCULAR | Status: DC | PRN

## 2015-03-06 MED ORDER — NALBUPHINE HCL 10 MG/ML IJ SOLN: 5.0000 mg | Freq: Once | INTRAMUSCULAR | Status: DC | PRN

## 2015-03-06 MED ORDER — KETOROLAC TROMETHAMINE 30 MG/ML IJ SOLN: 30.0000 mg | Freq: Four times a day (QID) | INTRAMUSCULAR | Status: DC | PRN

## 2015-03-06 MED ORDER — LACTATED RINGERS IV SOLN: INTRAVENOUS | Status: DC

## 2015-03-06 MED ORDER — CEFAZOLIN SODIUM-DEXTROSE 2-3 GM-% IV SOLR
2.0000 g | INTRAVENOUS | Status: AC
  Administered 2015-03-06: 2 g via INTRAVENOUS
  Filled 2015-03-06: qty 50

## 2015-03-06 MED ORDER — DIPHENHYDRAMINE HCL 25 MG PO CAPS
25.0000 mg | ORAL_CAPSULE | ORAL | Status: DC | PRN
  Filled 2015-03-06: qty 1

## 2015-03-06 MED ORDER — IBUPROFEN 600 MG PO TABS
600.0000 mg | ORAL_TABLET | Freq: Four times a day (QID) | ORAL | Status: DC
  Administered 2015-03-06 – 2015-03-08 (×8): 600 mg via ORAL
  Filled 2015-03-06 (×8): qty 1

## 2015-03-06 MED ORDER — FENTANYL CITRATE (PF) 100 MCG/2ML IJ SOLN: 25.0000 ug | INTRAMUSCULAR | Status: DC | PRN

## 2015-03-06 MED ORDER — SCOPOLAMINE 1 MG/3DAYS TD PT72
1.0000 | MEDICATED_PATCH | Freq: Once | TRANSDERMAL | Status: DC
  Administered 2015-03-06: 1.5 mg via TRANSDERMAL

## 2015-03-06 MED ORDER — ONDANSETRON HCL 4 MG/2ML IJ SOLN
INTRAMUSCULAR | Status: AC
  Filled 2015-03-06: qty 2

## 2015-03-06 MED ORDER — ZOLPIDEM TARTRATE 5 MG PO TABS: 5.0000 mg | ORAL_TABLET | Freq: Every evening | ORAL | Status: DC | PRN

## 2015-03-06 MED ORDER — NALOXONE HCL 2 MG/2ML IJ SOSY
1.0000 ug/kg/h | PREFILLED_SYRINGE | INTRAVENOUS | Status: DC | PRN
  Filled 2015-03-06: qty 2

## 2015-03-06 MED ORDER — KETOROLAC TROMETHAMINE 30 MG/ML IJ SOLN
INTRAMUSCULAR | Status: AC
  Administered 2015-03-06: 30 mg via INTRAMUSCULAR
  Filled 2015-03-06: qty 1

## 2015-03-06 MED ORDER — FENTANYL CITRATE (PF) 100 MCG/2ML IJ SOLN
INTRAMUSCULAR | Status: AC
  Filled 2015-03-06: qty 2

## 2015-03-06 MED ORDER — KETOROLAC TROMETHAMINE 30 MG/ML IJ SOLN
30.0000 mg | Freq: Four times a day (QID) | INTRAMUSCULAR | Status: DC | PRN
  Administered 2015-03-06: 30 mg via INTRAMUSCULAR

## 2015-03-06 MED ORDER — ONDANSETRON HCL 4 MG/2ML IJ SOLN: 4.0000 mg | Freq: Once | INTRAMUSCULAR | Status: DC | PRN

## 2015-03-06 MED ORDER — ACETAMINOPHEN 325 MG PO TABS: 650.0000 mg | ORAL_TABLET | ORAL | Status: DC | PRN

## 2015-03-06 MED ORDER — SODIUM CHLORIDE 0.9 % IJ SOLN: 3.0000 mL | INTRAMUSCULAR | Status: DC | PRN

## 2015-03-06 MED ORDER — MENTHOL 3 MG MT LOZG
1.0000 | LOZENGE | OROMUCOSAL | Status: DC | PRN
Start: 2015-03-06 — End: 2015-03-08

## 2015-03-06 MED ORDER — LACTATED RINGERS IV SOLN
40.0000 [IU] | INTRAVENOUS | Status: DC | PRN
  Administered 2015-03-06: 40 [IU] via INTRAVENOUS

## 2015-03-06 MED ORDER — NALOXONE HCL 0.4 MG/ML IJ SOLN: 0.4000 mg | INTRAMUSCULAR | Status: DC | PRN

## 2015-03-06 MED ORDER — ONDANSETRON HCL 4 MG/2ML IJ SOLN: 4.0000 mg | Freq: Three times a day (TID) | INTRAMUSCULAR | Status: DC | PRN

## 2015-03-06 MED ORDER — ONDANSETRON HCL 4 MG/2ML IJ SOLN
INTRAMUSCULAR | Status: DC | PRN
  Administered 2015-03-06: 4 mg via INTRAVENOUS

## 2015-03-06 MED ORDER — SIMETHICONE 80 MG PO CHEW
80.0000 mg | CHEWABLE_TABLET | Freq: Three times a day (TID) | ORAL | Status: DC
  Administered 2015-03-06 – 2015-03-08 (×4): 80 mg via ORAL
  Filled 2015-03-06 (×9): qty 1

## 2015-03-06 MED ORDER — CEFAZOLIN SODIUM-DEXTROSE 2-3 GM-% IV SOLR
INTRAVENOUS | Status: AC
  Filled 2015-03-06: qty 50

## 2015-03-06 MED ORDER — LACTATED RINGERS IV SOLN
Freq: Once | INTRAVENOUS | Status: AC
  Administered 2015-03-06: 09:00:00 via INTRAVENOUS

## 2015-03-06 MED ORDER — BUPIVACAINE IN DEXTROSE 0.75-8.25 % IT SOLN
INTRATHECAL | Status: DC | PRN
  Administered 2015-03-06: 1.6 mg via INTRATHECAL

## 2015-03-06 MED ORDER — SIMETHICONE 80 MG PO CHEW
80.0000 mg | CHEWABLE_TABLET | ORAL | Status: DC
  Administered 2015-03-06 – 2015-03-08 (×2): 80 mg via ORAL
  Filled 2015-03-06 (×4): qty 1

## 2015-03-06 MED ORDER — PHENYLEPHRINE 8 MG IN D5W 100 ML (0.08MG/ML) PREMIX OPTIME
INJECTION | INTRAVENOUS | Status: DC | PRN
  Administered 2015-03-06: 60 ug/min via INTRAVENOUS

## 2015-03-06 MED ORDER — SCOPOLAMINE 1 MG/3DAYS TD PT72: 1.0000 | MEDICATED_PATCH | Freq: Once | TRANSDERMAL | Status: DC

## 2015-03-06 MED ORDER — DIBUCAINE 1 % RE OINT: 1.0000 "application " | TOPICAL_OINTMENT | RECTAL | Status: DC | PRN

## 2015-03-06 MED ORDER — LACTATED RINGERS IV SOLN
INTRAVENOUS | Status: DC | PRN
  Administered 2015-03-06 (×3): via INTRAVENOUS

## 2015-03-06 MED ORDER — SCOPOLAMINE 1 MG/3DAYS TD PT72
MEDICATED_PATCH | TRANSDERMAL | Status: AC
  Administered 2015-03-06: 1.5 mg via TRANSDERMAL
  Filled 2015-03-06: qty 1

## 2015-03-06 MED ORDER — DIPHENHYDRAMINE HCL 50 MG/ML IJ SOLN: 12.5000 mg | INTRAMUSCULAR | Status: DC | PRN

## 2015-03-06 MED ORDER — OXYCODONE-ACETAMINOPHEN 5-325 MG PO TABS
1.0000 | ORAL_TABLET | ORAL | Status: DC | PRN
  Administered 2015-03-07 – 2015-03-08 (×5): 1 via ORAL
  Filled 2015-03-06 (×5): qty 1

## 2015-03-06 MED ORDER — ACETAMINOPHEN 500 MG PO TABS: 1000.0000 mg | ORAL_TABLET | Freq: Four times a day (QID) | ORAL | Status: DC

## 2015-03-06 MED ORDER — PRENATAL MULTIVITAMIN CH
1.0000 | ORAL_TABLET | Freq: Every day | ORAL | Status: DC
  Administered 2015-03-07 – 2015-03-08 (×2): 1 via ORAL
  Filled 2015-03-06 (×2): qty 1

## 2015-03-06 MED ORDER — OXYCODONE-ACETAMINOPHEN 5-325 MG PO TABS: 2.0000 | ORAL_TABLET | ORAL | Status: DC | PRN

## 2015-03-06 MED ORDER — MORPHINE SULFATE (PF) 0.5 MG/ML IJ SOLN
INTRAMUSCULAR | Status: DC | PRN
  Administered 2015-03-06: .2 mg via INTRATHECAL

## 2015-03-06 MED ORDER — OXYTOCIN 10 UNIT/ML IJ SOLN
INTRAMUSCULAR | Status: AC
  Filled 2015-03-06: qty 4

## 2015-03-06 MED ORDER — SIMETHICONE 80 MG PO CHEW: 80.0000 mg | CHEWABLE_TABLET | ORAL | Status: DC | PRN

## 2015-03-06 MED ORDER — LANOLIN HYDROUS EX OINT: 1.0000 "application " | TOPICAL_OINTMENT | CUTANEOUS | Status: DC | PRN

## 2015-03-06 MED ORDER — PHENYLEPHRINE 8 MG IN D5W 100 ML (0.08MG/ML) PREMIX OPTIME
INJECTION | INTRAVENOUS | Status: AC
  Filled 2015-03-06: qty 100

## 2015-03-06 MED ORDER — DIPHENHYDRAMINE HCL 25 MG PO CAPS
25.0000 mg | ORAL_CAPSULE | Freq: Four times a day (QID) | ORAL | Status: DC | PRN
  Filled 2015-03-06: qty 1

## 2015-03-06 MED ORDER — SENNOSIDES-DOCUSATE SODIUM 8.6-50 MG PO TABS
2.0000 | ORAL_TABLET | ORAL | Status: DC
  Administered 2015-03-06 – 2015-03-08 (×2): 2 via ORAL
  Filled 2015-03-06 (×4): qty 2

## 2015-03-06 MED ORDER — MEPERIDINE HCL 25 MG/ML IJ SOLN: 6.2500 mg | INTRAMUSCULAR | Status: DC | PRN

## 2015-03-06 MED ORDER — FENTANYL CITRATE (PF) 100 MCG/2ML IJ SOLN
INTRAMUSCULAR | Status: DC | PRN
  Administered 2015-03-06: 10 ug via INTRATHECAL

## 2015-03-06 SURGICAL SUPPLY — 38 items
CLAMP CORD UMBIL (MISCELLANEOUS) IMPLANT
CLOTH BEACON ORANGE TIMEOUT ST (SAFETY) ×3 IMPLANT
CONTAINER PREFILL 10% NBF 15ML (MISCELLANEOUS) ×6 IMPLANT
DRAPE SHEET LG 3/4 BI-LAMINATE (DRAPES) IMPLANT
DRSG OPSITE POSTOP 4X10 (GAUZE/BANDAGES/DRESSINGS) ×3 IMPLANT
DURAPREP 26ML APPLICATOR (WOUND CARE) ×3 IMPLANT
ELECT REM PT RETURN 9FT ADLT (ELECTROSURGICAL) ×3
ELECTRODE REM PT RTRN 9FT ADLT (ELECTROSURGICAL) ×1 IMPLANT
EXTRACTOR VACUUM M CUP 4 TUBE (SUCTIONS) IMPLANT
EXTRACTOR VACUUM M CUP 4' TUBE (SUCTIONS)
GLOVE BIO SURGEON STRL SZ8 (GLOVE) ×3 IMPLANT
GLOVE BIOGEL PI IND STRL 7.0 (GLOVE) ×1 IMPLANT
GLOVE BIOGEL PI INDICATOR 7.0 (GLOVE) ×2
GOWN STRL REUS W/TWL LRG LVL3 (GOWN DISPOSABLE) ×6 IMPLANT
KIT ABG SYR 3ML LUER SLIP (SYRINGE) IMPLANT
LIQUID BAND (GAUZE/BANDAGES/DRESSINGS) ×3 IMPLANT
NDL HYPO 25X5/8 SAFETYGLIDE (NEEDLE) ×1 IMPLANT
NEEDLE HYPO 22GX1.5 SAFETY (NEEDLE) ×3 IMPLANT
NEEDLE HYPO 25X5/8 SAFETYGLIDE (NEEDLE) ×3 IMPLANT
NS IRRIG 1000ML POUR BTL (IV SOLUTION) ×3 IMPLANT
PACK C SECTION WH (CUSTOM PROCEDURE TRAY) ×3 IMPLANT
PAD OB MATERNITY 4.3X12.25 (PERSONAL CARE ITEMS) ×3 IMPLANT
PENCIL SMOKE EVAC W/HOLSTER (ELECTROSURGICAL) ×3 IMPLANT
RTRCTR C-SECT PINK 25CM LRG (MISCELLANEOUS) ×3 IMPLANT
STAPLER VISISTAT 35W (STAPLE) ×2 IMPLANT
SUT GUT PLAIN 0 CT-3 TAN 27 (SUTURE) IMPLANT
SUT MNCRL 0 VIOLET CTX 36 (SUTURE) ×3 IMPLANT
SUT MNCRL AB 4-0 PS2 18 (SUTURE) IMPLANT
SUT MON AB 2-0 CT1 27 (SUTURE) ×3 IMPLANT
SUT MON AB 3-0 SH 27 (SUTURE)
SUT MON AB 3-0 SH27 (SUTURE) IMPLANT
SUT MONOCRYL 0 CTX 36 (SUTURE) ×6
SUT PLAIN 2 0 XLH (SUTURE) IMPLANT
SUT VIC AB 0 CTX 36 (SUTURE) ×6
SUT VIC AB 0 CTX36XBRD ANBCTRL (SUTURE) ×2 IMPLANT
SYR CONTROL 10ML LL (SYRINGE) ×3 IMPLANT
TOWEL OR 17X24 6PK STRL BLUE (TOWEL DISPOSABLE) ×3 IMPLANT
TRAY FOLEY CATH SILVER 14FR (SET/KITS/TRAYS/PACK) ×3 IMPLANT

## 2015-03-06 NOTE — Addendum Note (Signed)
Addendum  created 03/06/15 1818 by Yolonda KidaAlison L Scarleth Brame, CRNA   Modules edited: Clinical Notes   Clinical Notes:  File: [id number]

## 2015-03-06 NOTE — Anesthesia Postprocedure Evaluation (Signed)
Anesthesia Post Note  Patient: [name]  Procedure(s) Performed: Procedure(s) (LRB): PRIMARY CESAREAN SECTION (N/A)  Patient location during evaluation: PACU Anesthesia Type: Spinal Level of consciousness: awake and alert Pain management: pain level controlled Vital Signs Assessment: post-procedure vital signs reviewed and stable Respiratory status: spontaneous breathing, nonlabored ventilation, respiratory function stable and patient connected to nasal cannula oxygen Cardiovascular status: blood pressure returned to baseline and stable Postop Assessment: no signs of nausea or vomiting, patient able to bend at knees and spinal receding Anesthetic complications: no    Last Vitals:  Filed Vitals:   03/06/15 1215 03/06/15 1237  BP:  124/66  Pulse: 59 57  Temp:  36.7 C  Resp: 15 16    Last Pain:  Filed Vitals:   03/06/15 1238  PainSc: 5                  Dama Hedgepeth JENNETTE

## 2015-03-06 NOTE — Anesthesia Postprocedure Evaluation (Signed)
Anesthesia Post Note  Patient: [name]  Procedure(s) Performed: Procedure(s) (LRB): PRIMARY CESAREAN SECTION (N/A)  Patient location during evaluation: Mother Baby Anesthesia Type: Spinal Level of consciousness: awake, awake and alert, oriented and patient cooperative Pain management: pain level controlled Vital Signs Assessment: post-procedure vital signs reviewed and stable Respiratory status: spontaneous breathing, nonlabored ventilation and respiratory function stable Cardiovascular status: stable Postop Assessment: no headache, no backache, patient able to bend at knees and no signs of nausea or vomiting Anesthetic complications: no    Last Vitals:  Filed Vitals:   03/06/15 1435 03/06/15 1540  BP: 121/63 95/71  Pulse: 56 62  Temp: 36.8 C 36.9 C  Resp: 16 16    Last Pain:  Filed Vitals:   03/06/15 1736  PainSc: 4                  Hansini Clodfelter L

## 2015-03-06 NOTE — H&P (Signed)
[name] is a 29 y.o. female presenting for elective cesarean section. Maternal Medical History:  Reason for admission: Elective primary cesarean section.    OB History    Gravida Para Term Preterm AB TAB SAB Ectopic Multiple Living   5 3 3  0 1 0 1 0 0 3     Past Medical History  Diagnosis Date  . Headache(784.0)   . Abnormal Pap smear     HPV  . Anemia   . Postpartum hemorrhage    Past Surgical History  Procedure Laterality Date  . No past surgeries    . Dilation and evacuation N/A 06/11/2012    Procedure: DILATATION AND EVACUATION;  Surgeon: Kathreen CosierBernard A Marshall, MD;  Location: [facility];  Service: Gynecology;  Laterality: N/A;  with insertion Bakri Balloon  - Emergency  . Wisdom tooth extraction     Family History: family history includes Heart disease in her father; Hypertension in her father; Kidney disease in her father. There is no history of Hearing loss. Social History:  reports that she has been smoking Cigarettes.  She has a 1.75 pack-year smoking history. She has never used smokeless tobacco. She reports that she does not drink alcohol or use illicit drugs.   Prenatal Transfer Tool  Maternal Diabetes: No Genetic Screening: Normal Maternal Ultrasounds/Referrals: Normal Fetal Ultrasounds or other Referrals:  None Maternal Substance Abuse:  No Significant Maternal Medications:  None Significant Maternal Lab Results:  None Other Comments:  None  Review of Systems  All other systems reviewed and are negative.     Blood pressure 116/60, pulse 70, temperature 98.4 F (36.9 C), temperature source Oral, resp. rate 20, last menstrual period 05/29/2014, SpO2 100 %, unknown if currently breastfeeding. Maternal Exam:  Abdomen: Patient reports no abdominal tenderness.   Physical Exam  Nursing note and vitals reviewed. Constitutional: She is oriented to person, place, and time. She appears well-developed and well-nourished.  HENT:  Head: Normocephalic and  atraumatic.  Eyes: Conjunctivae are normal. Pupils are equal, round, and reactive to light.  Neck: Normal range of motion. Neck supple.  Cardiovascular: Normal rate and regular rhythm.   Respiratory: Effort normal and breath sounds normal.  GI: Soft. Bowel sounds are normal.  Musculoskeletal: Normal range of motion.  Neurological: She is alert and oriented to person, place, and time.  Skin: Skin is warm and dry.  Psychiatric: She has a normal mood and affect. Her behavior is normal. Judgment and thought content normal.    Prenatal labs: ABO, Rh: --/--/A NEG (12/20 0950) Antibody: POS (12/20 0950) Rubella: 6.99 (06/23 1505) RPR: Non Reactive (12/20 0950)  HBsAg: NEGATIVE (06/23 1505)  HIV: NONREACTIVE (09/20 1153)  GBS: DETECTED (12/01 1019)   Assessment/Plan: 39 weeks.  Elective primary cesarean section.  Risks and complications of cesarean section vs. vaginal delivery discussed.  All questions answered.  [name] A 03/06/2015, 8:32 AM

## 2015-03-06 NOTE — Transfer of Care (Signed)
Immediate Anesthesia Transfer of Care Note  Patient: [name]  Procedure(s) Performed: Procedure(s): PRIMARY CESAREAN SECTION (N/A)  Patient Location: PACU  Anesthesia Type:Spinal  Level of Consciousness: awake, alert  and oriented  Airway & Oxygen Therapy: Patient Spontanous Breathing  Post-op Assessment: Report given to RN  Post vital signs: Reviewed  Last Vitals:  Filed Vitals:   03/06/15 0829  BP: 116/60  Pulse: 70  Temp: 36.9 C  Resp: 20    Complications: No apparent anesthesia complications

## 2015-03-06 NOTE — Anesthesia Preprocedure Evaluation (Signed)
Anesthesia Evaluation  Patient identified by MRN, date of birth, ID band Patient awake    Reviewed: Allergy & Precautions, NPO status , Patient's Chart, lab work & pertinent test results  History of Anesthesia Complications Negative for: history of anesthetic complications  Airway Mallampati: II  TM Distance: >3 FB Neck ROM: Full    Dental no notable dental hx. (+) Dental Advisory Given   Pulmonary Current Smoker,    Pulmonary exam normal breath sounds clear to auscultation       Cardiovascular negative cardio ROS Normal cardiovascular exam Rhythm:Regular Rate:Normal     Neuro/Psych  Headaches, negative psych ROS   GI/Hepatic negative GI ROS, Neg liver ROS,   Endo/Other  negative endocrine ROS  Renal/GU negative Renal ROS  negative genitourinary   Musculoskeletal negative musculoskeletal ROS (+)   Abdominal   Peds negative pediatric ROS (+)  Hematology negative hematology ROS (+)   Anesthesia Other Findings   Reproductive/Obstetrics (+) Pregnancy                             Anesthesia Physical Anesthesia Plan  ASA: II  Anesthesia Plan: Spinal   Post-op Pain Management:    Induction: Intravenous  Airway Management Planned:   Additional Equipment:   Intra-op Plan:   Post-operative Plan:   Informed Consent: I have reviewed the patients History and Physical, chart, labs and discussed the procedure including the risks, benefits and alternatives for the proposed anesthesia with the patient or authorized representative who has indicated his/her understanding and acceptance.   Dental advisory given  Plan Discussed with: CRNA  Anesthesia Plan Comments:         Anesthesia Quick Evaluation

## 2015-03-06 NOTE — Op Note (Signed)
Cesarean Section Procedure Note   [name]   03/06/2015  Indications: Scheduled Proceedure/Maternal Request   Pre-operative Diagnosis: ELECTIVE,C/S.   Post-operative Diagnosis: Same   Surgeon: Sidnee Gambrill A  Assistants: MARSHALL, BERNARD A.  Anesthesia: spinal  Procedure Details:  The patient was seen in the Holding Room. The risks, benefits, complications, treatment options, and expected outcomes were discussed with the patient. The patient concurred with the proposed plan, giving informed consent. The patient was identified as [name] and the procedure verified as C-Section Delivery. A Time Out was held and the above information confirmed.  After induction of anesthesia, the patient was draped and prepped in the usual sterile manner. A transverse incision was made and carried down through the subcutaneous tissue to the fascia. The fascial incision was made and extended transversely. The fascia was separated from the underlying rectus tissue superiorly and inferiorly. The peritoneum was identified and entered. The peritoneal incision was extended longitudinally. The utero-vesical peritoneal reflection was incised transversely and the bladder flap was bluntly freed from the lower uterine segment. A low transverse uterine incision was made. Delivered from cephalic presentation was a 2995 gram living newborn female infant(s). APGAR (1 MIN): 8   APGAR (5 MINS): 9   APGAR (10 MINS):    A cord ph was not sent. The umbilical cord was clamped and cut cord. A sample was obtained for evaluation. The placenta was removed Intact and appeared normal.  The uterine incision was closed with running locked sutures of 0 Monocryl. A second imbricating layer of the same suture was placed.  Hemostasis was observed. The paracolic gutters were irrigated. The parieto peritoneum was closed in a running fashion with 2-0 Vicryl.  The fascia was then reapproximated with running sutures of 0 Vicryl.   The skin was closed with staples.  Instrument, sponge, and needle counts were correct prior the abdominal closure and were correct at the conclusion of the case.    Findings: Normal uterus, ovaries and tubes   Estimated Blood Loss: 800ml  Total IV Fluids: 2700ml   Urine Output: 200CC OF clear urine  Specimens: @ORSPECIMEN @   Complications: no complications  Disposition: PACU - hemodynamically stable.  Maternal Condition: stable   Baby condition / location:  Couplet care / Skin to Skin    Signed: Surgeon(s): Brock Badharles A Mamoudou Mulvehill, MD Kathreen CosierBernard A Marshall, MD

## 2015-03-06 NOTE — Anesthesia Procedure Notes (Signed)
Spinal Patient location during procedure: OR Staffing Anesthesiologist: Tamisha Nordstrom Performed by: anesthesiologist  Preanesthetic Checklist Completed: patient identified, site marked, surgical consent, pre-op evaluation, timeout performed, IV checked, risks and benefits discussed and monitors and equipment checked Spinal Block Patient position: sitting Prep: DuraPrep Patient monitoring: continuous pulse ox, blood pressure and heart rate Approach: midline Location: L3-4 Injection technique: single-shot Needle Needle type: Sprotte  Needle gauge: 24 G Needle length: 9 cm Additional Notes Functioning IV was confirmed and monitors were applied. Sterile prep and drape, including hand hygiene, mask and sterile gloves were used. The patient was positioned and the spine was prepped. The skin was anesthetized with lidocaine.  Free flow of clear CSF was obtained prior to injecting local anesthetic into the CSF.  The spinal needle aspirated freely following injection.  The needle was carefully withdrawn.  The patient tolerated the procedure well. Consent was obtained prior to procedure with all questions answered and concerns addressed. Risks including but not limited to bleeding, infection, nerve damage, paralysis, failed block, inadequate analgesia, allergic reaction, high spinal, itching and headache were discussed and the patient wished to proceed.   Ben Habermann, MD     

## 2015-03-07 ENCOUNTER — Encounter (HOSPITAL_COMMUNITY): Payer: Self-pay | Admitting: Obstetrics

## 2015-03-07 LAB — CBC
HEMATOCRIT: 25.1 % — AB (ref 36.0–46.0)
HEMOGLOBIN: 8.3 g/dL — AB (ref 12.0–15.0)
MCH: 30 pg (ref 26.0–34.0)
MCHC: 33.1 g/dL (ref 30.0–36.0)
MCV: 90.6 fL (ref 78.0–100.0)
Platelets: 215 10*3/uL (ref 150–400)
RBC: 2.77 MIL/uL — ABNORMAL LOW (ref 3.87–5.11)
RDW: 13.7 % (ref 11.5–15.5)
WBC: 14.4 10*3/uL — AB (ref 4.0–10.5)

## 2015-03-07 LAB — KLEIHAUER-BETKE STAIN
# Vials RhIg: 1
Fetal Cells %: 0.2 %
QUANTITATION FETAL HEMOGLOBIN: 10 mL

## 2015-03-07 MED ORDER — RHO D IMMUNE GLOBULIN 1500 UNIT/2ML IJ SOSY
300.0000 ug | PREFILLED_SYRINGE | Freq: Once | INTRAMUSCULAR | Status: AC
  Administered 2015-03-07: 300 ug via INTRAVENOUS
  Filled 2015-03-07: qty 2

## 2015-03-07 NOTE — Progress Notes (Signed)
Subjective: Postpartum Day 1: Cesarean Delivery Patient reports tolerating PO, + flatus and no problems voiding.    Objective: Vital signs in last 24 hours: Temp:  [97 F (36.1 C)-98.4 F (36.9 C)] 97 F (36.1 C) (12/22 0820) Pulse Rate:  [55-62] 56 (12/22 0820) Resp:  [16-20] 18 (12/22 0820) BP: (95-132)/(59-78) 120/72 mmHg (12/22 0820) SpO2:  [98 %-100 %] 98 % (12/22 0820)  Physical Exam:  General: alert and no distress Lochia: appropriate Uterine Fundus: firm Incision: healing well DVT Evaluation: No evidence of DVT seen on physical exam.   Recent Labs  03/05/15 0950 03/07/15 0535  HGB 9.7* 8.3*  HCT 28.8* 25.1*    Assessment/Plan: Status post Cesarean section. Doing well postoperatively.  Anemia, chronic iron deficiency, stable.  Iron Rx. Continue current care.  Jericho Alcorn A 03/07/2015, 1:26 PM

## 2015-03-07 NOTE — Progress Notes (Signed)
UR chart review completed.  

## 2015-03-08 ENCOUNTER — Other Ambulatory Visit: Payer: Self-pay | Admitting: Certified Nurse Midwife

## 2015-03-08 LAB — TYPE AND SCREEN
ABO/RH(D): A NEG
Antibody Screen: POSITIVE
DAT, IGG: NEGATIVE
UNIT DIVISION: 0
UNIT DIVISION: 0

## 2015-03-08 LAB — RH IG WORKUP (INCLUDES ABO/RH)
ABO/RH(D): A NEG
FETAL SCREEN: POSITIVE
Gestational Age(Wks): 39.1
UNIT DIVISION: 0

## 2015-03-08 MED ORDER — OXYCODONE-ACETAMINOPHEN 5-325 MG PO TABS: 2.0000 | ORAL_TABLET | ORAL | Status: DC | PRN

## 2015-03-08 MED ORDER — IBUPROFEN 600 MG PO TABS: 600.0000 mg | ORAL_TABLET | Freq: Four times a day (QID) | ORAL | Status: DC

## 2015-03-08 MED ORDER — FERRALET 90 90-1 MG PO TABS: 1.0000 | ORAL_TABLET | Freq: Every day | ORAL | Status: DC

## 2015-03-08 NOTE — Progress Notes (Signed)
Subjective: Postpartum Day #2: Cesarean Delivery Patient reports tolerating PO, + flatus and no problems voiding.  Desires to go home today.  Reports only spotting for vaginal bleeding.   Objective: Vital signs in last 24 hours: Temp:  [97.6 F (36.4 C)-98.4 F (36.9 C)] 98.4 F (36.9 C) (12/23 0615) Pulse Rate:  [55-56] 56 (12/23 0615) Resp:  [16-18] 18 (12/23 0615) BP: (122-123)/(65-66) 122/66 mmHg (12/23 0615)  Physical Exam:  General: alert, cooperative and no distress Lochia: appropriate Uterine Fundus: firm Incision: no significant drainage DVT Evaluation: No evidence of DVT seen on physical exam. No cords or calf tenderness. No significant calf/ankle edema.   Recent Labs  03/05/15 0950 03/07/15 0535  HGB 9.7* 8.3*  HCT 28.8* 25.1*    Assessment/Plan: Status post Cesarean section. Doing well postoperatively.  Early discharge home with standard precautions later today. Anemia.   Kiely Cousar A Sandra Tellefsen 03/08/2015, 8:24 AM

## 2015-03-08 NOTE — Discharge Summary (Signed)
Obstetric Discharge Summary Reason for Admission: cesarean section Prenatal Procedures: ultrasound Intrapartum Procedures: cesarean: low cervical, transverse Postpartum Procedures: none Complications-Operative and Postpartum: none HEMOGLOBIN  Date Value Ref Range Status  03/07/2015 8.3* 12.0 - 15.0 g/dL Final   HCT  Date Value Ref Range Status  03/07/2015 25.1* 36.0 - 46.0 % Final    Physical Exam:  General: alert, cooperative and no distress Lochia: appropriate Uterine Fundus: firm Incision: no significant drainage DVT Evaluation: No evidence of DVT seen on physical exam. No cords or calf tenderness. No significant calf/ankle edema.  Discharge Diagnoses: Term Pregnancy-delivered  Discharge Information: Date: 03/08/2015 Activity: pelvic rest Diet: routine Medications: PNV, Ibuprofen, Iron and Percocet Condition: stable Instructions: refer to practice specific booklet Discharge to: home   Newborn Data: Live born female  Birth Weight: 6 lb 9.6 oz (2995 g) APGAR: 8, 9  Home with mother.          [name] 03/08/2015, 8:29 AM

## 2015-03-12 ENCOUNTER — Ambulatory Visit (INDEPENDENT_AMBULATORY_CARE_PROVIDER_SITE_OTHER): Payer: Medicaid Other | Admitting: Obstetrics

## 2015-03-12 ENCOUNTER — Encounter: Payer: Medicaid Other | Admitting: Obstetrics

## 2015-03-12 DIAGNOSIS — O9081 Anemia of the puerperium: Secondary | ICD-10-CM

## 2015-03-12 MED ORDER — FUSION PLUS PO CAPS: 1.0000 | ORAL_CAPSULE | Freq: Every day | ORAL | Status: DC

## 2015-03-12 NOTE — Progress Notes (Signed)
Post Partum Day 5 S/P C-section with labor RH status/Rubella reviewed.  Feeding: bottle Subjective: No HA, SOB, CP, F/C, breast symptoms. Normal vaginal bleeding, no clots.     Objective: BP 133/88 mmHg  Pulse 66  Temp(Src) 99.3 F (37.4 C)  LMP 05/29/2014 (Approximate)  Breastfeeding? No I&O reviewed.  DTRS:negative  Physical Exam:  General: alert and no distress  Abdomen: soft, NT.  Incision C, D, I.  Staples removed and steri strips applied to incision Lochia: appropriate Uterine Fundus: firm DVT Evaluation: No evidence of DVT seen on physical exam. Ext: No c/c/e No results for input(s): HGB, HCT in the last 72 hours.    Assessment/Plan: 29 y.o.  POD #5, LTCS.  Normal post-operative exam.  Doing well. Staples removed. F/U 2 weeks    HARPER,CHARLES A 03/12/2015, 4:42 PM

## 2015-03-13 ENCOUNTER — Encounter: Payer: Self-pay | Admitting: Obstetrics

## 2015-03-19 ENCOUNTER — Encounter: Payer: Medicaid Other | Admitting: Obstetrics

## 2015-04-01 ENCOUNTER — Ambulatory Visit: Payer: Medicaid Other | Admitting: Obstetrics

## 2015-05-20 ENCOUNTER — Encounter: Payer: Self-pay | Admitting: Obstetrics

## 2015-05-20 ENCOUNTER — Ambulatory Visit (INDEPENDENT_AMBULATORY_CARE_PROVIDER_SITE_OTHER): Payer: Medicaid Other | Admitting: Obstetrics

## 2015-05-20 DIAGNOSIS — Z30018 Encounter for initial prescription of other contraceptives: Secondary | ICD-10-CM

## 2015-05-20 DIAGNOSIS — R52 Pain, unspecified: Secondary | ICD-10-CM

## 2015-05-20 DIAGNOSIS — D509 Iron deficiency anemia, unspecified: Secondary | ICD-10-CM

## 2015-05-20 LAB — CBC
HEMATOCRIT: 36.1 % (ref 36.0–46.0)
Hemoglobin: 11.4 g/dL — ABNORMAL LOW (ref 12.0–15.0)
MCH: 27.9 pg (ref 26.0–34.0)
MCHC: 31.6 g/dL (ref 30.0–36.0)
MCV: 88.5 fL (ref 78.0–100.0)
MPV: 9.8 fL (ref 8.6–12.4)
Platelets: 362 10*3/uL (ref 150–400)
RBC: 4.08 MIL/uL (ref 3.87–5.11)
RDW: 14.3 % (ref 11.5–15.5)
WBC: 6.8 10*3/uL (ref 4.0–10.5)

## 2015-05-20 MED ORDER — OXYCODONE-ACETAMINOPHEN 5-325 MG PO TABS: 2.0000 | ORAL_TABLET | ORAL | Status: DC | PRN

## 2015-05-21 ENCOUNTER — Encounter: Payer: Self-pay | Admitting: Obstetrics

## 2015-05-21 NOTE — Progress Notes (Signed)
Subjective:     [name] is a 30 y.o. female who presents for a postpartum visit. She is 10 weeks postpartum following a low cervical transverse Cesarean section. I have fully reviewed the prenatal and intrapartum course. The delivery was at 39 gestational weeks. Outcome: repeat cesarean section, low transverse incision. Anesthesia: spinal. Postpartum course has been normal. Baby's course has been normal. Baby is feeding by bottle - Similac Advance. Bleeding no bleeding. Bowel function is normal. Bladder function is normal. Patient is sexually active. Contraception method is none. Postpartum depression screening: negative.  Tobacco, alcohol and substance abuse history reviewed.  Adult immunizations reviewed including TDAP, rubella and varicella.  The following portions of the patient's history were reviewed and updated as appropriate: allergies, current medications, past family history, past medical history, past social history, past surgical history and problem list.  Review of Systems A comprehensive review of systems was negative.   Objective:    BP 146/80 mmHg  Pulse 57  Temp(Src) 99.1 F (37.3 C)  Wt 130 lb (58.968 kg)  LMP 04/30/2015 (Approximate)  Breastfeeding? No  General:  alert and no distress   Breasts:  inspection negative, no nipple discharge or bleeding, no masses or nodularity palpable  Lungs: clear to auscultation bilaterally  Heart:  regular rate and rhythm, S1, S2 normal, no murmur, click, rub or gallop  Abdomen: soft, non-tender; bowel sounds normal; no masses,  no organomegaly   Vulva:  normal  Vagina: normal vagina, no discharge, exudate, lesion, or erythema  Cervix:  no cervical motion tenderness  Corpus: normal size, contour, position, consistency, mobility, non-tender  Adnexa:  no mass, fullness, tenderness  Rectal Exam: Not performed.          Assessment:     Normal postpartum exam. Pap smear not done at today's visit.    Contraceptive counseling  and advice  Chronic Hip Pain  Plan:    1. Contraception: IUD 2. ParaGuard IUD Rx 3. Referred to Ortho. For hip pain 4. Follow up in: 2 weeks or as needed.   Healthy lifestyle practices reviewed

## 2015-05-22 ENCOUNTER — Telehealth: Payer: Self-pay | Admitting: *Deleted

## 2015-05-22 NOTE — Telephone Encounter (Signed)
Patient states Dr Clearance CootsHarper forgot to call in her antibiotic.

## 2015-05-24 ENCOUNTER — Other Ambulatory Visit: Payer: Self-pay | Admitting: Obstetrics

## 2015-05-24 DIAGNOSIS — N760 Acute vaginitis: Principal | ICD-10-CM

## 2015-05-24 DIAGNOSIS — B9689 Other specified bacterial agents as the cause of diseases classified elsewhere: Secondary | ICD-10-CM

## 2015-05-24 DIAGNOSIS — B373 Candidiasis of vulva and vagina: Secondary | ICD-10-CM

## 2015-05-24 DIAGNOSIS — B3731 Acute candidiasis of vulva and vagina: Secondary | ICD-10-CM

## 2015-05-24 MED ORDER — FLUCONAZOLE 150 MG PO TABS: 150.0000 mg | ORAL_TABLET | Freq: Once | ORAL | Status: DC

## 2015-05-24 MED ORDER — METRONIDAZOLE 500 MG PO TABS: 500.0000 mg | ORAL_TABLET | Freq: Two times a day (BID) | ORAL | Status: DC

## 2015-05-24 NOTE — Telephone Encounter (Signed)
Flagyl / Diflucan Rx.  Patient called.

## 2015-06-03 ENCOUNTER — Ambulatory Visit: Payer: Medicaid Other | Admitting: Obstetrics

## 2015-06-13 ENCOUNTER — Telehealth: Payer: Self-pay | Admitting: Obstetrics

## 2015-06-13 NOTE — Telephone Encounter (Signed)
Pt called asking to reschedule her appt for her IUD, stating that she missed the appt because she was on her cycle. Pt was asked the last time she had sex, which was 2-3 wks ago and if she was currently on any form of birth control. Pt stated that she's only using condoms. Spoke to [place] and pt was advised to call us at the start of her next cycle and to not have unprotected sex between now and then. Patient acknowledged that she understood and would call one the first day of her next cycle.

## 2015-06-25 ENCOUNTER — Ambulatory Visit (INDEPENDENT_AMBULATORY_CARE_PROVIDER_SITE_OTHER): Payer: Medicaid Other | Admitting: Certified Nurse Midwife

## 2015-06-25 VITALS — BP 141/94 | HR 62 | Wt 128.0 lb

## 2015-06-25 DIAGNOSIS — N939 Abnormal uterine and vaginal bleeding, unspecified: Secondary | ICD-10-CM

## 2015-06-25 DIAGNOSIS — Z30430 Encounter for insertion of intrauterine contraceptive device: Secondary | ICD-10-CM | POA: Diagnosis not present

## 2015-06-25 DIAGNOSIS — Z01818 Encounter for other preprocedural examination: Secondary | ICD-10-CM

## 2015-06-25 DIAGNOSIS — Z30014 Encounter for initial prescription of intrauterine contraceptive device: Secondary | ICD-10-CM

## 2015-06-25 DIAGNOSIS — R030 Elevated blood-pressure reading, without diagnosis of hypertension: Secondary | ICD-10-CM

## 2015-06-25 DIAGNOSIS — IMO0001 Reserved for inherently not codable concepts without codable children: Secondary | ICD-10-CM | POA: Insufficient documentation

## 2015-06-25 DIAGNOSIS — Z3202 Encounter for pregnancy test, result negative: Secondary | ICD-10-CM

## 2015-06-25 LAB — POCT URINE PREGNANCY: PREG TEST UR: NEGATIVE

## 2015-06-25 MED ORDER — IBUPROFEN 800 MG PO TABS
800.0000 mg | ORAL_TABLET | Freq: Three times a day (TID) | ORAL | Status: DC | PRN
Start: 2015-06-25 — End: 2022-05-14

## 2015-06-25 NOTE — Addendum Note (Signed)
Addended by: Marya LandryFOSTER, Yisroel Mullendore D on: 06/25/2015 04:53 PM   Modules accepted: Orders

## 2015-06-25 NOTE — Progress Notes (Signed)
Patient ID: [name], female   DOB: 10/12/1985, 30 y.o.   MRN: [id number]  IUD Procedure Note   DIAGNOSIS: Desires long-term, reversible contraception   PROCEDURE: IUD placement Performing Provider: Orvilla Cornwallachelle Lanai Conlee CNM  Patient counseled prior to procedure. I explained risks and benefits of Paraguard IUD, reviewed alternative forms of contraception. Patient stated understanding and consented to continue with procedure.   LMP: 06/24/15 Pregnancy Test: Negative Lot #: [id number] Expiration Date: April 2023   IUD type: [   ] Mirena   [X]  Paraguard  [   ] Christean GriefSkyla   [   ]  Kyleena  PROCEDURE:  Timeout procedure was performed to ensure right patient and right site.  A bimanual exam was performed to determine the position of the uterus, retroverted. The speculum was placed. The vagina and cervix was sterilized in the usual manner and sterile technique was maintained throughout the course of the procedure. A single toothed tenaculum was not required. The depth of the uterus was sounded to 9cm. With gentle traction on the tenaculum, the IUD was inserted to the appropriate depth and inserted without difficulty.  The string was cut to an estimated 4 cm length. Bleeding was minimal. The patient tolerated the procedure well.   Follow up: The patient tolerated the procedure well without complications.  Standard post-procedure care is explained and return precautions are given.  PCP referral made for elevated blood pressure.    Orvilla Cornwallachelle Shizue Kaseman CNM

## 2015-06-28 LAB — NUSWAB VAGINITIS PLUS (VG+)
Candida albicans, NAA: NEGATIVE
Candida glabrata, NAA: NEGATIVE
Chlamydia trachomatis, NAA: NEGATIVE
NEISSERIA GONORRHOEAE, NAA: NEGATIVE
Trich vag by NAA: NEGATIVE

## 2015-07-16 ENCOUNTER — Telehealth: Payer: Self-pay | Admitting: *Deleted

## 2015-07-16 NOTE — Telephone Encounter (Signed)
Patient is calling for pain medication refill for her hip pain. If she can't have the narcotic then she would like the muscle relaxor offered previously and a referral. Call forwarded to provider.

## 2015-07-26 ENCOUNTER — Ambulatory Visit: Payer: Medicaid Other | Admitting: Certified Nurse Midwife

## 2015-08-02 ENCOUNTER — Ambulatory Visit: Payer: Medicaid Other | Admitting: Certified Nurse Midwife

## 2015-08-20 ENCOUNTER — Encounter: Payer: Self-pay | Admitting: Certified Nurse Midwife

## 2015-08-20 ENCOUNTER — Ambulatory Visit (INDEPENDENT_AMBULATORY_CARE_PROVIDER_SITE_OTHER): Payer: Medicaid Other | Admitting: Certified Nurse Midwife

## 2015-08-20 VITALS — BP 144/94 | HR 67 | Temp 98.8°F | Wt 130.0 lb

## 2015-08-20 DIAGNOSIS — R51 Headache: Secondary | ICD-10-CM | POA: Diagnosis not present

## 2015-08-20 DIAGNOSIS — R030 Elevated blood-pressure reading, without diagnosis of hypertension: Secondary | ICD-10-CM | POA: Diagnosis not present

## 2015-08-20 DIAGNOSIS — T8339XA Other mechanical complication of intrauterine contraceptive device, initial encounter: Secondary | ICD-10-CM

## 2015-08-20 DIAGNOSIS — Z716 Tobacco abuse counseling: Secondary | ICD-10-CM

## 2015-08-20 DIAGNOSIS — Z720 Tobacco use: Secondary | ICD-10-CM | POA: Diagnosis not present

## 2015-08-20 DIAGNOSIS — T839XXA Unspecified complication of genitourinary prosthetic device, implant and graft, initial encounter: Secondary | ICD-10-CM | POA: Insufficient documentation

## 2015-08-20 DIAGNOSIS — IMO0001 Reserved for inherently not codable concepts without codable children: Secondary | ICD-10-CM

## 2015-08-20 DIAGNOSIS — R519 Headache, unspecified: Secondary | ICD-10-CM

## 2015-08-20 MED ORDER — VARENICLINE TARTRATE 0.5 MG X 11 & 1 MG X 42 PO MISC: ORAL | Status: DC

## 2015-08-20 MED ORDER — VARENICLINE TARTRATE 1 MG PO TABS: 1.0000 mg | ORAL_TABLET | Freq: Two times a day (BID) | ORAL | Status: DC

## 2015-08-20 NOTE — Addendum Note (Signed)
Addended by: Marya LandryFOSTER, Menaal Russum D on: 08/20/2015 04:36 PM   Modules accepted: Orders

## 2015-08-20 NOTE — Progress Notes (Signed)
Patient ID: [name], female   DOB: 11/21/1985, 30 y.o.   MRN: [id number]   Chief Complaint  Patient presents with  . Contraception    Removal ???    HPI Connie Scottbony S Moseman is a 30 y.o. female.  Here for f/u on IUD.  Had ParaGard IUD inserted in April 2017.  Has not checked for IUD strings.  Reports daily brown vaginal discharge and wears a panty liner.  States that she has vaginal itching when she wears a panty liner.  Discussed the normalcy of the discharge and that it should resolve in the next few months.  Desires to keep IUD.  States that she has had daily HA since insertion.  Neurology consult ordered, has tried fioricet.  Is a daily smoker with elevated blood pressure today in the office.  Encouraged [facility] quit line number and patient desires to quit smoking.  Smokes about 3 cigg/day.  Has smoked since she was 30 years old.   Chantix started.   HPI  Past Medical History  Diagnosis Date  . Headache(784.0)   . Abnormal Pap smear     HPV  . Anemia   . Postpartum hemorrhage     Past Surgical History  Procedure Laterality Date  . No past surgeries    . Dilation and evacuation N/A 06/11/2012    Procedure: DILATATION AND EVACUATION;  Surgeon: Kathreen CosierBernard A Marshall, MD;  Location: [facility];  Service: Gynecology;  Laterality: N/A;  with insertion Bakri Balloon  - Emergency  . Wisdom tooth extraction    . Cesarean section N/A 03/06/2015    Procedure: PRIMARY CESAREAN SECTION;  Surgeon: Brock Badharles A Harper, MD;  Location: [facility];  Service: Obstetrics;  Laterality: N/A;    Family History  Problem Relation Age of Onset  . Hypertension Father   . Heart disease Father   . Kidney disease Father   . Hearing loss Neg Hx     Social History Social History  Substance Use Topics  . Smoking status: Light Tobacco Smoker -- 0.25 packs/day for 7 years    Types: Cigarettes  . Smokeless tobacco: Never Used  . Alcohol Use: No    No Known Allergies  Current Outpatient Prescriptions  Medication  Sig Dispense Refill  . butalbital-acetaminophen-caffeine (FIORICET, ESGIC) 50-325-40 MG tablet Take by mouth 2 (two) times daily as needed for headache.    . ibuprofen (ADVIL,MOTRIN) 800 MG tablet Take 1 tablet (800 mg total) by mouth every 8 (eight) hours as needed. 60 tablet 1  . Iron-FA-B Cmp-C-Biot-Probiotic (FUSION PLUS) CAPS Take 1 capsule by mouth daily before breakfast. 30 capsule 5  . Prenat-FeCbn-FeAsp-Meth-FA-DHA (PRENATE MINI) 18-0.6-0.4-350 MG CAPS Take 1 capsule by mouth daily. 30 capsule 11  . varenicline (CHANTIX CONTINUING MONTH PAK) 1 MG tablet Take 1 tablet (1 mg total) by mouth 2 (two) times daily. 60 tablet 5  . varenicline (CHANTIX STARTING MONTH PAK) 0.5 MG X 11 & 1 MG X 42 tablet Take a 0.5mg  tablet 1X/day for 3 days, increase to twice a day for 4 days, then take 1 mg tablet twice a day. 53 tablet 0   No current facility-administered medications for this visit.    Review of Systems Review of Systems Constitutional: negative for fatigue and weight loss Respiratory: negative for cough and wheezing Cardiovascular: negative for chest pain, fatigue and palpitations Gastrointestinal: negative for abdominal pain and change in bowel habits Genitourinary:negative Integument/breast: negative for nipple discharge Musculoskeletal:negative for myalgias Neurological: negative for gait problems and tremors  Behavioral/Psych: negative for abusive relationship, depression Endocrine: negative for temperature intolerance     Blood pressure 144/94, pulse 67, temperature 98.8 F (37.1 C), weight 130 lb (58.968 kg), not currently breastfeeding.  Physical Exam Physical Exam General:   alert  Skin:   no rash or abnormalities  Lungs:   clear to auscultation bilaterally  Heart:   regular rate and rhythm, S1, S2 normal, no murmur, click, rub or gallop  Breasts:   deferred  Abdomen:  normal findings: no organomegaly, soft, non-tender and no hernia  Pelvis:  External genitalia: normal  general appearance Urinary system: urethral meatus normal and bladder without fullness, nontender Vaginal: normal without tenderness, induration or masses Cervix: normal appearance, IUD strings not presen Adnexa: normal bimanual exam Uterus: anteverted and non-tender, normal size    50% of 15 min visit spent on counseling and coordination of care.   Data Reviewed Previous medical hx, meds, labs  Assessment     Elevated blood pressure today, no hx of HTN Daily HA, new onset IUD strings not visualized     Plan    Orders Placed This Encounter  Procedures  . US Pelvis Complete    Standing Status: Future     Number of Occurrences:      Standing Expiration Date: 10/19/2016    Order Specific Question:  Reason for Exam (SYMPTOM  OR DIAGNOSIS REQUIRED)    Answer:  IUD strings not visualized    Order Specific Question:  Preferred imaging location?    Answer:  Internal  . US Transvaginal Non-OB    Standing Status: Future     Number of Occurrences:      Standing Expiration Date: 10/19/2016    Order Specific Question:  Reason for Exam (SYMPTOM  OR DIAGNOSIS REQUIRED)    Answer:  IUD strings not visualized    Order Specific Question:  Preferred imaging location?    Answer:  Internal  . Ambulatory referral to Internal Medicine    Referral Priority:  Routine    Referral Type:  Consultation    Referral Reason:  Specialty Services Required    Requested Specialty:  Internal Medicine    Number of Visits Requested:  1  . Ambulatory referral to Neurology    Referral Priority:  Routine    Referral Type:  Consultation    Referral Reason:  Specialty Services Required    Requested Specialty:  Neurology    Number of Visits Requested:  1   Meds ordered this encounter  Medications  . varenicline (CHANTIX CONTINUING MONTH PAK) 1 MG tablet    Sig: Take 1 tablet (1 mg total) by mouth 2 (two) times daily.    Dispense:  60 tablet    Refill:  5  . varenicline (CHANTIX STARTING MONTH PAK) 0.5 MG X  11 & 1 MG X 42 tablet    Sig: Take a 0.5mg  tablet 1X/day for 3 days, increase to twice a day for 4 days, then take 1 mg tablet twice a day.    Dispense:  53 tablet    Refill:  0     Possible management options include: IUD removal and BTL.   Follow up as needed and after pelvic US. Educated to use backup protection until after pelvic US obtained.

## 2015-08-22 ENCOUNTER — Other Ambulatory Visit: Payer: Medicaid Other

## 2015-08-23 ENCOUNTER — Other Ambulatory Visit: Payer: Self-pay | Admitting: Certified Nurse Midwife

## 2015-08-23 DIAGNOSIS — N760 Acute vaginitis: Principal | ICD-10-CM

## 2015-08-23 DIAGNOSIS — B9689 Other specified bacterial agents as the cause of diseases classified elsewhere: Secondary | ICD-10-CM

## 2015-08-23 MED ORDER — METRONIDAZOLE 0.75 % VA GEL: 1.0000 | Freq: Two times a day (BID) | VAGINAL | Status: DC

## 2015-08-24 LAB — NUSWAB VG+, CANDIDA 6SP
BVAB 2: HIGH Score — AB
CANDIDA GLABRATA, NAA: NEGATIVE
CANDIDA KRUSEI, NAA: NEGATIVE
CANDIDA LUSITANIAE, NAA: NEGATIVE
CANDIDA PARAPSILOSIS, NAA: NEGATIVE
CANDIDA TROPICALIS, NAA: NEGATIVE
Candida albicans, NAA: NEGATIVE
Chlamydia trachomatis, NAA: NEGATIVE
Neisseria gonorrhoeae, NAA: NEGATIVE
TRICH VAG BY NAA: NEGATIVE

## 2015-08-26 ENCOUNTER — Encounter: Payer: Self-pay | Admitting: *Deleted

## 2015-08-29 ENCOUNTER — Other Ambulatory Visit: Payer: Medicaid Other

## 2015-09-03 ENCOUNTER — Ambulatory Visit (INDEPENDENT_AMBULATORY_CARE_PROVIDER_SITE_OTHER): Payer: Medicaid Other

## 2015-09-03 DIAGNOSIS — Z30431 Encounter for routine checking of intrauterine contraceptive device: Secondary | ICD-10-CM | POA: Diagnosis not present

## 2015-09-03 DIAGNOSIS — T8339XA Other mechanical complication of intrauterine contraceptive device, initial encounter: Secondary | ICD-10-CM

## 2015-09-13 ENCOUNTER — Ambulatory Visit: Payer: Medicaid Other | Admitting: Neurology

## 2015-09-14 ENCOUNTER — Other Ambulatory Visit: Payer: Self-pay | Admitting: Certified Nurse Midwife

## 2015-11-15 ENCOUNTER — Other Ambulatory Visit: Payer: Self-pay | Admitting: Certified Nurse Midwife

## 2015-11-19 ENCOUNTER — Ambulatory Visit: Payer: Medicaid Other | Admitting: Neurology

## 2015-12-25 ENCOUNTER — Other Ambulatory Visit: Payer: Self-pay | Admitting: Certified Nurse Midwife

## 2016-01-14 ENCOUNTER — Other Ambulatory Visit: Payer: Self-pay | Admitting: Certified Nurse Midwife

## 2016-01-14 NOTE — Telephone Encounter (Signed)
Please send refill if approved.
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
## 2016-03-25 ENCOUNTER — Encounter (HOSPITAL_COMMUNITY): Payer: Self-pay | Admitting: *Deleted

## 2016-03-25 ENCOUNTER — Emergency Department (HOSPITAL_COMMUNITY)
Admission: EM | Admit: 2016-03-25 | Discharge: 2016-03-25 | Disposition: A | Discharge status: Home / Self Care | Payer: Medicaid Other | Attending: Emergency Medicine | Admitting: Emergency Medicine

## 2016-03-25 DIAGNOSIS — S161XXA Strain of muscle, fascia and tendon at neck level, initial encounter: Secondary | ICD-10-CM | POA: Insufficient documentation

## 2016-03-25 DIAGNOSIS — Y939 Activity, unspecified: Secondary | ICD-10-CM | POA: Insufficient documentation

## 2016-03-25 DIAGNOSIS — F17210 Nicotine dependence, cigarettes, uncomplicated: Secondary | ICD-10-CM | POA: Insufficient documentation

## 2016-03-25 DIAGNOSIS — Z79899 Other long term (current) drug therapy: Secondary | ICD-10-CM | POA: Insufficient documentation

## 2016-03-25 DIAGNOSIS — Y929 Unspecified place or not applicable: Secondary | ICD-10-CM | POA: Insufficient documentation

## 2016-03-25 DIAGNOSIS — W500XXA Accidental hit or strike by another person, initial encounter: Secondary | ICD-10-CM | POA: Insufficient documentation

## 2016-03-25 DIAGNOSIS — Y999 Unspecified external cause status: Secondary | ICD-10-CM | POA: Insufficient documentation

## 2016-03-25 DIAGNOSIS — S0083XA Contusion of other part of head, initial encounter: Secondary | ICD-10-CM

## 2016-03-25 MED ORDER — CYCLOBENZAPRINE HCL 10 MG PO TABS: 10.0000 mg | ORAL_TABLET | Freq: Two times a day (BID) | ORAL | 0 refills | Status: DC | PRN

## 2016-03-25 MED ORDER — NAPROXEN 500 MG PO TABS: 500.0000 mg | ORAL_TABLET | Freq: Two times a day (BID) | ORAL | 0 refills | Status: DC

## 2016-03-25 NOTE — ED Notes (Signed)
Declined W/C at D/C and was escorted to lobby by RN. 

## 2016-03-25 NOTE — ED Triage Notes (Signed)
Pt reports spasms and pain to left side of throat, neck and face. Airway intact. No acute distress noted.
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
## 2020-08-01 ENCOUNTER — Ambulatory Visit: Payer: Self-pay | Admitting: Obstetrics

## 2020-10-30 ENCOUNTER — Encounter (HOSPITAL_COMMUNITY): Payer: Self-pay

## 2020-10-30 ENCOUNTER — Emergency Department (HOSPITAL_COMMUNITY): Payer: Self-pay

## 2020-10-30 ENCOUNTER — Other Ambulatory Visit: Payer: Self-pay

## 2020-10-30 ENCOUNTER — Emergency Department (HOSPITAL_COMMUNITY)
Admission: EM | Admit: 2020-10-30 | Discharge: 2020-10-30 | Disposition: A | Discharge status: Home / Self Care | Payer: Self-pay | Attending: Emergency Medicine | Admitting: Emergency Medicine

## 2020-10-30 DIAGNOSIS — Z3A00 Weeks of gestation of pregnancy not specified: Secondary | ICD-10-CM | POA: Insufficient documentation

## 2020-10-30 DIAGNOSIS — R059 Cough, unspecified: Secondary | ICD-10-CM | POA: Insufficient documentation

## 2020-10-30 DIAGNOSIS — R0981 Nasal congestion: Secondary | ICD-10-CM | POA: Insufficient documentation

## 2020-10-30 DIAGNOSIS — Z5321 Procedure and treatment not carried out due to patient leaving prior to being seen by health care provider: Secondary | ICD-10-CM | POA: Insufficient documentation

## 2020-10-30 DIAGNOSIS — Z20822 Contact with and (suspected) exposure to covid-19: Secondary | ICD-10-CM | POA: Insufficient documentation

## 2020-10-30 DIAGNOSIS — M5431 Sciatica, right side: Secondary | ICD-10-CM | POA: Insufficient documentation

## 2020-10-30 DIAGNOSIS — O26899 Other specified pregnancy related conditions, unspecified trimester: Secondary | ICD-10-CM | POA: Insufficient documentation

## 2020-10-30 DIAGNOSIS — R1011 Right upper quadrant pain: Secondary | ICD-10-CM | POA: Insufficient documentation

## 2020-10-30 DIAGNOSIS — R509 Fever, unspecified: Secondary | ICD-10-CM | POA: Insufficient documentation

## 2020-10-30 DIAGNOSIS — R110 Nausea: Secondary | ICD-10-CM | POA: Insufficient documentation

## 2020-10-30 DIAGNOSIS — R531 Weakness: Secondary | ICD-10-CM | POA: Insufficient documentation

## 2020-10-30 LAB — CBC WITH DIFFERENTIAL/PLATELET
Abs Immature Granulocytes: 0.21 10*3/uL — ABNORMAL HIGH (ref 0.00–0.07)
Basophils Absolute: 0 10*3/uL (ref 0.0–0.1)
Basophils Relative: 0 %
Eosinophils Absolute: 0 10*3/uL (ref 0.0–0.5)
Eosinophils Relative: 0 %
HCT: 38 % (ref 36.0–46.0)
Hemoglobin: 12.5 g/dL (ref 12.0–15.0)
Immature Granulocytes: 1 %
Lymphocytes Relative: 3 %
Lymphs Abs: 0.6 10*3/uL — ABNORMAL LOW (ref 0.7–4.0)
MCH: 29.7 pg (ref 26.0–34.0)
MCHC: 32.9 g/dL (ref 30.0–36.0)
MCV: 90.3 fL (ref 80.0–100.0)
Monocytes Absolute: 1.8 10*3/uL — ABNORMAL HIGH (ref 0.1–1.0)
Monocytes Relative: 9 %
Neutro Abs: 16.9 10*3/uL — ABNORMAL HIGH (ref 1.7–7.7)
Neutrophils Relative %: 87 %
Platelets: 347 10*3/uL (ref 150–400)
RBC: 4.21 MIL/uL (ref 3.87–5.11)
RDW: 14 % (ref 11.5–15.5)
WBC: 19.6 10*3/uL — ABNORMAL HIGH (ref 4.0–10.5)
nRBC: 0 % (ref 0.0–0.2)

## 2020-10-30 LAB — RESP PANEL BY RT-PCR (FLU A&B, COVID) ARPGX2
Influenza A by PCR: NEGATIVE
Influenza B by PCR: NEGATIVE
SARS Coronavirus 2 by RT PCR: NEGATIVE

## 2020-10-30 LAB — COMPREHENSIVE METABOLIC PANEL
ALT: 15 U/L (ref 0–44)
AST: 20 U/L (ref 15–41)
Albumin: 3.3 g/dL — ABNORMAL LOW (ref 3.5–5.0)
Alkaline Phosphatase: 79 U/L (ref 38–126)
Anion gap: 11 (ref 5–15)
BUN: 12 mg/dL (ref 6–20)
CO2: 23 mmol/L (ref 22–32)
Calcium: 9.2 mg/dL (ref 8.9–10.3)
Chloride: 99 mmol/L (ref 98–111)
Creatinine, Ser: 1.04 mg/dL — ABNORMAL HIGH (ref 0.44–1.00)
GFR, Estimated: >60 mL/min (ref >60)
Glucose, Bld: 159 mg/dL — ABNORMAL HIGH (ref 70–99)
Potassium: 3.6 mmol/L (ref 3.5–5.1)
Sodium: 133 mmol/L — ABNORMAL LOW (ref 135–145)
Total Bilirubin: 0.7 mg/dL (ref 0.3–1.2)
Total Protein: 7.3 g/dL (ref 6.5–8.1)

## 2020-10-30 LAB — I-STAT BETA HCG BLOOD, ED (MC, WL, AP ONLY): I-stat hCG, quantitative: 29 m[IU]/mL — ABNORMAL HIGH (ref <5)

## 2020-10-30 LAB — LIPASE, BLOOD: Lipase: 25 U/L (ref 11–51)

## 2020-10-30 MED ORDER — ACETAMINOPHEN 325 MG PO TABS
650.0000 mg | ORAL_TABLET | Freq: Once | ORAL | Status: AC
  Administered 2020-10-30: 650 mg via ORAL
  Filled 2020-10-30: qty 2

## 2020-10-30 NOTE — ED Triage Notes (Signed)
Pt has multiple complaints:  Cough, body aches, weakness, right sided sciatica pain and nausea.  Denies COVID exposure.

## 2020-10-30 NOTE — ED Notes (Signed)
Pt not responding to vital signs.

## 2020-10-30 NOTE — ED Provider Notes (Signed)
Emergency Medicine Provider Triage Evaluation Note  [name] , a 35 y.o. female  was evaluated in triage.  Pt complains of abdominal pain (RUQ), cough, congestion, body aches, fever, sciatica.  Review of Systems  Positive: Abdominal pain, fever, cough Negative: Changes in bowel or bladder habits  Physical Exam  BP 126/89 (BP Location: Right Arm)   Pulse (!) 117   Temp (!) 102.5 F (39.2 C) (Oral)   Resp 18   SpO2 100%  Gen:   Awake, no distress   Resp:  Normal effort  MSK:   Moves extremities without difficulty  Other:  RUQ pain on exam, no CVA tenderness  Medical Decision Making  Medically screening exam initiated at 10:27 AM.  Appropriate orders placed.  [name] was informed that the remainder of the evaluation will be completed by another provider, this initial triage assessment does not replace that evaluation, and the importance of remaining in the ED until their evaluation is complete.     Jeannie Fend, PA-C 10/30/20 1032    Terald Sleeper, MD 10/30/20 1048

## 2021-07-14 ENCOUNTER — Telehealth: Payer: Self-pay | Admitting: *Deleted

## 2021-07-14 NOTE — Telephone Encounter (Signed)
Pt called to office with questions about IUD and d/c. ?Pt states she has been having issues with her cycles with Paragard IUD that was placed by our office.  Pt states she is also having issues with d/c.   ?Pt states she currently has no insurance and would like to know the best place to be seen. ? ?Pt advised to be seen at [facility] for a routine exam and IUD check. ?Advised pt she may want to see if she qualifies for Medicaid as well. ? ?

## 2021-09-04 ENCOUNTER — Ambulatory Visit (HOSPITAL_COMMUNITY)
Admission: EM | Admit: 2021-09-04 | Discharge: 2021-09-04 | Disposition: A | Discharge status: Home / Self Care | Payer: Self-pay | Attending: Physician Assistant | Admitting: Physician Assistant

## 2021-09-04 ENCOUNTER — Encounter (HOSPITAL_COMMUNITY): Payer: Self-pay

## 2021-09-04 DIAGNOSIS — N898 Other specified noninflammatory disorders of vagina: Secondary | ICD-10-CM | POA: Insufficient documentation

## 2021-09-04 DIAGNOSIS — Z202 Contact with and (suspected) exposure to infections with a predominantly sexual mode of transmission: Secondary | ICD-10-CM | POA: Insufficient documentation

## 2021-09-04 MED ORDER — CEFTRIAXONE SODIUM 500 MG IJ SOLR
INTRAMUSCULAR | Status: AC
  Filled 2021-09-04: qty 500

## 2021-09-04 MED ORDER — CEFTRIAXONE SODIUM 500 MG IJ SOLR
500.0000 mg | Freq: Once | INTRAMUSCULAR | Status: AC
  Administered 2021-09-04: 500 mg via INTRAMUSCULAR

## 2021-09-04 MED ORDER — DOXYCYCLINE HYCLATE 100 MG PO CAPS: 100.0000 mg | ORAL_CAPSULE | Freq: Two times a day (BID) | ORAL | 0 refills | Status: DC

## 2021-09-04 MED ORDER — METRONIDAZOLE 500 MG PO TABS: 500.0000 mg | ORAL_TABLET | Freq: Two times a day (BID) | ORAL | 0 refills | Status: AC

## 2021-09-04 MED ORDER — LIDOCAINE HCL (PF) 1 % IJ SOLN
INTRAMUSCULAR | Status: AC
  Filled 2021-09-04: qty 2

## 2021-09-04 NOTE — ED Triage Notes (Signed)
Pt states she is here for STD testing states she is having some yellow vaginal discharge for  one week.

## 2021-09-05 LAB — CERVICOVAGINAL ANCILLARY ONLY
Chlamydia: NEGATIVE
Comment: NEGATIVE
Comment: NEGATIVE
Comment: NORMAL
Neisseria Gonorrhea: NEGATIVE
Trichomonas: POSITIVE — AB

## 2022-05-14 ENCOUNTER — Ambulatory Visit
Admission: EM | Admit: 2022-05-14 | Discharge: 2022-05-14 | Disposition: A | Discharge status: Home / Self Care | Payer: 59 | Attending: Internal Medicine | Admitting: Internal Medicine

## 2022-05-14 DIAGNOSIS — J069 Acute upper respiratory infection, unspecified: Secondary | ICD-10-CM

## 2022-05-14 LAB — POCT INFLUENZA A/B
Influenza A, POC: NEGATIVE
Influenza B, POC: NEGATIVE

## 2022-05-14 NOTE — ED Triage Notes (Signed)
Patient with c/o body aches and chills. States her legs are throbbing and her butt muscles feel sore. Unsure if she had a fever at home. States she is hot then cold.

## 2022-05-14 NOTE — ED Provider Notes (Signed)
[facility]    CSN: [id number] Arrival date & time: 05/14/22  1729      History   Chief Complaint Chief Complaint  Patient presents with   Generalized Body Aches   Cough    HPI [name] is a 37 y.o. female.   Patient presents with generalized bodyaches, chills, cough, nasal congestion that started yesterday.  Patient has taken ibuprofen and Tylenol for symptoms.  Patient denies any known fevers or sick contacts.  Denies sore throat, chest pain, shortness of breath, gastrointestinal symptoms.  Patient denies history of asthma.   Cough   Past Medical History:  Diagnosis Date   Abnormal Pap smear    HPV   Anemia    Headache(784.0)    Postpartum hemorrhage     Patient Active Problem List   Diagnosis Date Noted   IUD complication ([place]) 123456   Elevated blood pressure 06/25/2015   S/P cesarean section 03/06/2015   Migraine 08/22/2012    Past Surgical History:  Procedure Laterality Date   CESAREAN SECTION N/A 03/06/2015   Procedure: PRIMARY CESAREAN SECTION;  Surgeon: Shelly Bombard, MD;  Location: [facility];  Service: Obstetrics;  Laterality: N/A;   DILATION AND EVACUATION N/A 06/11/2012   Procedure: DILATATION AND EVACUATION;  Surgeon: Frederico Hamman, MD;  Location: [facility];  Service: Gynecology;  Laterality: N/A;  with insertion Bakri Balloon  - Emergency   NO PAST SURGERIES     WISDOM TOOTH EXTRACTION      OB History     Gravida  5   Para  4   Term  4   Preterm  0   AB  1   Living  4      SAB  1   IAB  0   Ectopic  0   Multiple  0   Live Births  4            Home Medications    Prior to Admission medications   Medication Sig Start Date End Date Taking? Authorizing Provider  varenicline (CHANTIX CONTINUING MONTH PAK) 1 MG tablet Take 1 tablet (1 mg total) by mouth 2 (two) times daily. 08/20/15   Kandis Cocking A, CNM  varenicline (CHANTIX STARTING MONTH PAK) 0.5 MG X 11 & 1 MG X 42 tablet Take a 0.'5mg'$   tablet 1X/day for 3 days, increase to twice a day for 4 days, then take 1 mg tablet twice a day. 08/20/15   Morene Crocker, CNM    Family History Family History  Problem Relation Age of Onset   Hypertension Father    Heart disease Father    Kidney disease Father    Hearing loss Neg Hx     Social History Social History   Tobacco Use   Smoking status: Light Smoker    Packs/day: 0.25    Years: 7.00    Total pack years: 1.75    Types: Cigarettes   Smokeless tobacco: Never  Substance Use Topics   Alcohol use: No    Alcohol/week: 0.0 standard drinks of alcohol   Drug use: No     Allergies   Patient has no known allergies.   Review of Systems Review of Systems Per HPI  Physical Exam Triage Vital Signs ED Triage Vitals  Enc Vitals Group     BP 05/14/22 1758 112/68     Pulse Rate 05/14/22 1758 96     Resp 05/14/22 1758 18     Temp 05/14/22 1758  98.9 F (37.2 C)     Temp Source 05/14/22 1758 Oral     SpO2 05/14/22 1758 97 %     Weight --      Height --      Head Circumference --      Peak Flow --      Pain Score 05/14/22 1759 10     Pain Loc --      Pain Edu? --      Excl. in [place]? --    No data found.  Updated Vital Signs BP 112/68 (BP Location: Left Arm)   Pulse 96   Temp 98.9 F (37.2 C) (Oral)   Resp 18   LMP 05/07/2022 (Approximate)   SpO2 97%   Visual Acuity Right Eye Distance:   Left Eye Distance:   Bilateral Distance:    Right Eye Near:   Left Eye Near:    Bilateral Near:     Physical Exam Constitutional:      General: She is not in acute distress.    Appearance: Normal appearance. She is not toxic-appearing or diaphoretic.  HENT:     Head: Normocephalic and atraumatic.     Right Ear: Tympanic membrane and ear canal normal.     Left Ear: Tympanic membrane and ear canal normal.     Nose: Congestion present.     Mouth/Throat:     Mouth: Mucous membranes are moist.     Pharynx: No posterior oropharyngeal erythema.  Eyes:      Extraocular Movements: Extraocular movements intact.     Conjunctiva/sclera: Conjunctivae normal.     Pupils: Pupils are equal, round, and reactive to light.  Cardiovascular:     Rate and Rhythm: Normal rate and regular rhythm.     Pulses: Normal pulses.     Heart sounds: Normal heart sounds.  Pulmonary:     Effort: Pulmonary effort is normal. No respiratory distress.     Breath sounds: Normal breath sounds. No stridor. No wheezing, rhonchi or rales.  Abdominal:     General: Abdomen is flat. Bowel sounds are normal.     Palpations: Abdomen is soft.  Musculoskeletal:        General: Normal range of motion.     Cervical back: Normal range of motion.  Skin:    General: Skin is warm and dry.  Neurological:     General: No focal deficit present.     Mental Status: She is alert and oriented to person, place, and time. Mental status is at baseline.  Psychiatric:        Mood and Affect: Mood normal.        Behavior: Behavior normal.      UC Treatments / Results  Labs (all labs ordered are listed, but only abnormal results are displayed) Labs Reviewed  POCT INFLUENZA A/B    EKG   Radiology No results found.  Procedures Procedures (including critical care time)  Medications Ordered in UC Medications - No data to display  Initial Impression / Assessment and Plan / UC Course  I have reviewed the triage vital signs and the nursing notes.  Pertinent labs & imaging results that were available during my care of the patient were reviewed by me and considered in my medical decision making (see chart for details).     Patient presents with symptoms likely from a viral upper respiratory infection.  Do not suspect underlying cardiopulmonary process. Symptoms seem unlikely related to ACS, CHF or COPD exacerbations, pneumonia, pneumothorax.  Patient is nontoxic appearing and not in need of emergent medical intervention. Rapid flu negative. Patient declined covid testing.   Recommended  symptom control with over the counter medications.   Return if symptoms fail to improve in 1-2 weeks or you develop shortness of breath, chest pain, severe headache. Patient states understanding and is agreeable.  Discharged with PCP followup.  Final Clinical Impressions(s) / UC Diagnoses   Final diagnoses:  Viral upper respiratory tract infection with cough     Discharge Instructions      Flu is negative.  It appears that you have a viral illness as we discussed that should run its course and self resolve with the help of symptomatic treatment.  Follow-up if any symptoms persist or worsen.    ED Prescriptions   None    PDMP not reviewed this encounter.   Teodora Medici, [place] 05/14/22 [phone]

## 2022-05-14 NOTE — Discharge Instructions (Signed)
Flu is negative.  It appears that you have a viral illness as we discussed that should run its course and self resolve with the help of symptomatic treatment.  Follow-up if any symptoms persist or worsen.

## 2022-12-17 ENCOUNTER — Ambulatory Visit: Payer: 59 | Admitting: Obstetrics and Gynecology

## 2022-12-17 ENCOUNTER — Encounter: Payer: Self-pay | Admitting: Obstetrics and Gynecology

## 2022-12-17 ENCOUNTER — Other Ambulatory Visit (HOSPITAL_COMMUNITY)
Admission: RE | Admit: 2022-12-17 | Discharge: 2022-12-17 | Disposition: A | Discharge status: Home / Self Care | Payer: 59 | Source: Ambulatory Visit | Attending: Obstetrics and Gynecology | Admitting: Obstetrics and Gynecology

## 2022-12-17 ENCOUNTER — Ambulatory Visit (INDEPENDENT_AMBULATORY_CARE_PROVIDER_SITE_OTHER): Payer: 59 | Admitting: Obstetrics and Gynecology

## 2022-12-17 VITALS — BP 122/79 | HR 68 | Wt 122.0 lb

## 2022-12-17 DIAGNOSIS — Z30432 Encounter for removal of intrauterine contraceptive device: Secondary | ICD-10-CM

## 2022-12-17 DIAGNOSIS — Z124 Encounter for screening for malignant neoplasm of cervix: Secondary | ICD-10-CM

## 2022-12-17 DIAGNOSIS — Z01419 Encounter for gynecological examination (general) (routine) without abnormal findings: Secondary | ICD-10-CM | POA: Diagnosis not present

## 2022-12-17 DIAGNOSIS — Z113 Encounter for screening for infections with a predominantly sexual mode of transmission: Secondary | ICD-10-CM

## 2022-12-17 NOTE — Progress Notes (Signed)
   ANNUAL EXAM Patient name: [name] MRN [id number]  Date of birth: [birth year] Chief Complaint:   Annual Exam and Contraception  History of Present Illness:   [name] is a 37 y.o. [id number] female being seen today for a routine annual exam.   Current concerns: Bleeding d/t IUD.   Current birth control: Paragrd. She would like it removed due to bleeding heavily with it.   No LMP recorded. Currently on period.   Last Pap/Pap History: 2016. Results were: NILM w/ HRHPV negative. H/O abnormal pap: no  Review of Systems:   Pertinent items are noted in HPI Denies any headaches, blurred vision, fatigue, shortness of breath, chest pain, abdominal pain, abnormal vaginal discharge/itching/odor/irritation, bowel movements, urination, or intercourse unless otherwise stated above.  Pertinent History Reviewed:  Reviewed past medical,surgical, social and family history.  Reviewed problem list, medications and allergies. Physical Assessment:   Vitals:   12/17/22 1533  BP: 122/79  Pulse: 68  Weight: 122 lb (55.3 kg)  Body mass index is 18.55 kg/m.   Physical Examination:  General appearance - well appearing, and in no distress Mental status - alert, oriented to person, place, and time Psych:  She has a normal mood and affect Skin - warm and dry, normal color, no suspicious lesions noted Chest - effort normal Heart - normal rate  Breasts - breasts appear normal, no suspicious masses, no skin or nipple changes or axillary nodes Abdomen - soft, nontender, nondistended, no masses or organomegaly Pelvic -  VULVA: normal appearing vulva with no masses, tenderness or lesions  VAGINA: normal appearing vagina with normal color and discharge, no lesions  CERVIX: normal appearing cervix without discharge or lesions, no CMT UTERUS: uterus is felt to be normal size, shape, consistency and nontender  ADNEXA: No adnexal masses or tenderness noted. Extremities:  No swelling or varicosities  noted  Chaperone present for exam  No results found for this or any previous visit (from the past 24 hour(s)).     GYNECOLOGY OFFICE PROCEDURE NOTE  IUD Removal  Patient identified, informed consent performed, consent signed.  Patient was in the dorsal lithotomy position, normal external genitalia was noted.  A speculum was placed in the patient's vagina, normal discharge was noted, no lesions. The cervix was visualized, no lesions, no abnormal discharge.  The strings of the IUD were grasped and pulled using ring forceps. The IUD was removed in its entirety.   Patient tolerated the procedure well.    Patient will use condoms for contraception.  Assessment & Plan:  [name] was seen today for annual exam and contraception.  Diagnoses and all orders for this visit:  Encounter for annual routine gynecological examination - Cervical cancer screening: Discussed guidelines. Pap with HPV done - GC/CT: accepts - Birth Control: Condoms - Breast Health: Encouraged self breast awareness/SBE. Teaching provided.  - F/U 12 months and prn -     Cytology - PAP( [place])  Encounter for IUD removal - IUD removed per pt request - Birth control options given   No orders of the defined types were placed in this encounter.   Meds: No orders of the defined types were placed in this encounter.   Follow-up: Return in about 1 year (around 12/17/2023) for annual.  Milas Hock, MD 12/17/2022 4:02 PM

## 2022-12-23 LAB — CYTOLOGY - PAP
Chlamydia: NEGATIVE
Comment: NEGATIVE
Comment: NEGATIVE
Comment: NORMAL
Diagnosis: NEGATIVE
High risk HPV: NEGATIVE
Neisseria Gonorrhea: NEGATIVE

## 2023-09-03 ENCOUNTER — Ambulatory Visit
Admission: EM | Admit: 2023-09-03 | Discharge: 2023-09-03 | Disposition: A | Discharge status: Home / Self Care | Attending: Emergency Medicine | Admitting: Emergency Medicine

## 2023-09-03 ENCOUNTER — Ambulatory Visit

## 2023-09-03 ENCOUNTER — Ambulatory Visit (HOSPITAL_COMMUNITY): Payer: Self-pay

## 2023-09-03 DIAGNOSIS — S62635A Displaced fracture of distal phalanx of left ring finger, initial encounter for closed fracture: Secondary | ICD-10-CM

## 2023-09-03 MED ORDER — OXYCODONE-ACETAMINOPHEN 5-325 MG PO TABS: 1.0000 | ORAL_TABLET | Freq: Four times a day (QID) | ORAL | 0 refills | Status: DC | PRN

## 2023-09-03 MED ORDER — LIDOCAINE HCL (PF) 1 % IJ SOLN
5.0000 mL | Freq: Once | INTRAMUSCULAR | Status: AC
  Administered 2023-09-03: 5 mL

## 2023-09-03 NOTE — Discharge Instructions (Addendum)
 You are seen in the urgent care today for concerns of a finger injury.  You are found to have a fracture of the left ring finger.  We attempted to reduce this here in the urgent care.  You required follow-up with hand surgery/orthopedics for further management of your injury.  Please reach out to the listed provider for an appointment to have your hand looked at.

## 2023-09-03 NOTE — ED Provider Notes (Signed)
 [facility]    CSN: [id number] Arrival date & time: 09/03/23  0806      History   Chief Complaint Chief Complaint  Patient presents with   Finger Problem    HPI [name] is a 38 y.o. female. Patient presents to the urgent care today with concerns of finger pain. Was reportedly playing around yesterday and had someone run into her hand. Did not feel significant pain at that time but states pain has been worsening with worsening swelling and difficulty moving now.  HPI  Past Medical History:  Diagnosis Date   Abnormal Pap smear    HPV   Anemia    Headache(784.0)    Postpartum hemorrhage     Patient Active Problem List   Diagnosis Date Noted   Elevated blood pressure 06/25/2015   S/P cesarean section 03/06/2015   Migraine 08/22/2012    Past Surgical History:  Procedure Laterality Date   CESAREAN SECTION N/A 03/06/2015   Procedure: PRIMARY CESAREAN SECTION;  Surgeon: Gabrielle Joiner, MD;  Location: [facility];  Service: Obstetrics;  Laterality: N/A;   DILATION AND EVACUATION N/A 06/11/2012   Procedure: DILATATION AND EVACUATION;  Surgeon: Heide Livings, MD;  Location: [facility];  Service: Gynecology;  Laterality: N/A;  with insertion Bakri Balloon  - Emergency   NO PAST SURGERIES     WISDOM TOOTH EXTRACTION      OB History     Gravida  5   Para  4   Term  4   Preterm  0   AB  1   Living  4      SAB  1   IAB  0   Ectopic  0   Multiple  0   Live Births  4            Home Medications    Prior to Admission medications   Medication Sig Start Date End Date Taking? Authorizing Provider  oxyCODONE -acetaminophen  (PERCOCET/ROXICET) 5-325 MG tablet Take 1 tablet by mouth every 6 (six) hours as needed for severe pain (pain score 7-10). 09/03/23  Yes Tupac Jeffus A, PA-C  diphenhydrAMINE  (BENADRYL ) 25 mg capsule Take 25 mg by mouth every 6 (six) hours as needed.    [provider]    Family History Family History   Problem Relation Age of Onset   Hypertension Father    Heart disease Father    Kidney disease Father    Hearing loss Neg Hx     Social History Social History   Tobacco Use   Smoking status: Light Smoker    Current packs/day: 0.25    Average packs/day: 0.3 packs/day for 7.0 years (1.8 ttl pk-yrs)    Types: Cigarettes   Smokeless tobacco: Never  Vaping Use   Vaping status: Never Used  Substance Use Topics   Alcohol use: No    Alcohol/week: 0.0 standard drinks of alcohol   Drug use: No     Allergies   Patient has no known allergies.   Review of Systems Review of Systems  Musculoskeletal:        Hand pain   All other systems reviewed and are negative.    Physical Exam Triage Vital Signs ED Triage Vitals  Encounter Vitals Group     BP 09/03/23 0824 118/77     Girls Systolic BP Percentile --      Girls Diastolic BP Percentile --      Boys Systolic BP Percentile --  Boys Diastolic BP Percentile --      Pulse Rate 09/03/23 0824 74     Resp 09/03/23 0824 18     Temp 09/03/23 0824 98.2 F (36.8 C)     Temp Source 09/03/23 0824 Oral     SpO2 09/03/23 0824 99 %     Weight 09/03/23 0822 125 lb (56.7 kg)     Height 09/03/23 0822 5' 8 (1.727 m)     Head Circumference --      Peak Flow --      Pain Score 09/03/23 0819 10     Pain Loc --      Pain Education --      Exclude from Growth Chart --    No data found.  Updated Vital Signs BP 118/77 (BP Location: Left Arm)   Pulse 74   Temp 98.2 F (36.8 C) (Oral)   Resp 18   Ht 5' 8 (1.727 m)   Wt 125 lb (56.7 kg)   LMP 08/30/2023 (Exact Date)   SpO2 99%   BMI 19.01 kg/m   Visual Acuity Right Eye Distance:   Left Eye Distance:   Bilateral Distance:    Right Eye Near:   Left Eye Near:    Bilateral Near:     Physical Exam Vitals and nursing note reviewed.  Constitutional:      General: She is not in acute distress.    Appearance: She is well-developed.  HENT:     Head: Normocephalic and  atraumatic.   Eyes:     Conjunctiva/sclera: Conjunctivae normal.    Cardiovascular:     Rate and Rhythm: Normal rate and regular rhythm.     Heart sounds: No murmur heard. Pulmonary:     Effort: Pulmonary effort is normal. No respiratory distress.     Breath sounds: Normal breath sounds.  Abdominal:     Palpations: Abdomen is soft.     Tenderness: There is no abdominal tenderness.   Musculoskeletal:        General: Swelling, tenderness, deformity and signs of injury present.       Arms:     Cervical back: Neck supple.     Comments: Left 4th digit with swelling and rotation of the phalanx. No bruising seen. Neurovascularly intact.   Skin:    General: Skin is warm and dry.     Capillary Refill: Capillary refill takes less than 2 seconds.   Neurological:     Mental Status: She is alert.   Psychiatric:        Mood and Affect: Mood normal.      UC Treatments / Results  Labs (all labs ordered are listed, but only abnormal results are displayed) Labs Reviewed - No data to display  EKG   Radiology DG Finger Ring Left Result Date: 09/03/2023 CLINICAL DATA:  Injured left hand fourth finger when playing around. No swollen. Heart removed. Left fourth finger pain. EXAM: LEFT RING FINGER 2+V COMPARISON:  None Available. FINDINGS: There are two oblique fracture line lucencies within the mid to distal shaft of the proximal phalanx of the fourth finger indicating acute mildly displaced fracture with up to 2 mm lateral/radial displacement of the distal fracture component on frontal view, 2 mm dorsal displacement of the distal aspect of the distal fracture component on lateral view, and approximately 16 mm volar apex angulation of the fracture on lateral view. Joint spaces are maintained. No intra-articular fracture line extension is seen. No dislocation. Moderate surrounding fourth finger  soft tissue swelling. IMPRESSION: Acute mildly displaced and angulated extra-articular fracture of  the mid to distal shaft of the proximal phalanx of the fourth finger. Electronically Signed   By: Bertina Broccoli M.D.   On: 09/03/2023 08:47    Procedures Procedures (including critical care time)  Medications Ordered in UC Medications  lidocaine  (PF) (XYLOCAINE ) 1 % injection 5 mL (5 mLs Infiltration Given 09/03/23 0849)    Initial Impression / Assessment and Plan / UC Course  I have reviewed the triage vital signs and the nursing notes.  Pertinent labs & imaging results that were available during my care of the patient were reviewed by me and considered in my medical decision making (see chart for details).   This patient presents to the urgent care for concern of hand pain.  Differential diagnosis includes finger fracture, finger dislocation, nailbed injury, nail avulsion   Imaging Studies ordered:  I ordered imaging studies including x-ray of the right ring finger I independently visualized and interpreted imaging which showed Acute mildly displaced and angulated extra-articular fracture of the mid to distal shaft of the proximal phalanx of the fourth finger. I agree with the radiologist interpretation   Medicines ordered and prescription drug management:  I ordered medication including lidocaine  for nerve block Reevaluation of the patient after these medicines showed that the patient improved I have reviewed the patients home medicines and have made adjustments as needed   Problem List / ED Course:  Patient presents to the urgent care today with concerns of a finger injury.  Reports that she was playing around yesterday when she had someone ran into her hand.  Did not report severe pain at that time but has had progressively worsening pain and swelling since the injury last night.  States that she is having difficulty fully flexing extending the ring finger of the left hand.  No prior history of any surgeries in this area.  Tried managing with over-the-counter medications for  pain with minimal improvement. On exam, the left finger is slightly externally rotated with some visible deformity towards the proximal phalanx.  Will obtain x-ray imaging for evaluation of the suspected fracture. X-ray shows an acute mildly displaced and angulated extra-articular fracture of the mid to distal shaft of the proximal phalanx of the fourth finger.  Given slight angulation and pain, will attempt a reduction after nerve block.  Given fracture occurred greater than 12 hours ago, suspect this would likely be difficult to fully reduce.  Anticipate likely need for outpatient follow-up with orthopedics and hand surgery. Repeat film shows preserved displacement but slightly improved compared to initial image.  Given finding, will have patient follow-up soon with hand surgery for repeat evaluation.  Discharging home with a prescription for Percocet for pain management.  Advised return precautions.  Otherwise stable for outpatient follow-up and discharged home.  Final Clinical Impressions(s) / UC Diagnoses   Final diagnoses:  Closed displaced fracture of distal phalanx of left ring finger, initial encounter     Discharge Instructions      You are seen in the urgent care today for concerns of a finger injury.  You are found to have a fracture of the left ring finger.  We attempted to reduce this here in the urgent care.  You required follow-up with hand surgery/orthopedics for further management of your injury.  Please reach out to the listed provider for an appointment to have your hand looked at.     ED Prescriptions  Medication Sig Dispense Auth. Provider   oxyCODONE -acetaminophen  (PERCOCET/ROXICET) 5-325 MG tablet Take 1 tablet by mouth every 6 (six) hours as needed for severe pain (pain score 7-10). 15 tablet Dava Rensch A, PA-C      PDMP not reviewed this encounter.   Nadene Witherspoon A, PA-C 09/03/23 [id number]

## 2023-09-03 NOTE — ED Triage Notes (Signed)
 I injured my left hand (4th/ring finger) when playing around, now swollen, hard to move. DOI 06-19 HS.

## 2023-12-07 ENCOUNTER — Encounter: Payer: Self-pay | Admitting: Emergency Medicine

## 2023-12-07 ENCOUNTER — Ambulatory Visit
Admission: EM | Admit: 2023-12-07 | Discharge: 2023-12-07 | Disposition: A | Discharge status: Home / Self Care | Attending: Family Medicine | Admitting: Family Medicine

## 2023-12-07 ENCOUNTER — Other Ambulatory Visit: Payer: Self-pay

## 2023-12-07 DIAGNOSIS — K047 Periapical abscess without sinus: Secondary | ICD-10-CM | POA: Diagnosis not present

## 2023-12-07 MED ORDER — AMOXICILLIN-POT CLAVULANATE 875-125 MG PO TABS: 1.0000 | ORAL_TABLET | Freq: Two times a day (BID) | ORAL | 0 refills | Status: AC

## 2023-12-07 MED ORDER — KETOROLAC TROMETHAMINE 30 MG/ML IJ SOLN
30.0000 mg | Freq: Once | INTRAMUSCULAR | Status: AC
  Administered 2023-12-07: 30 mg via INTRAMUSCULAR

## 2023-12-07 MED ORDER — KETOROLAC TROMETHAMINE 10 MG PO TABS: 10.0000 mg | ORAL_TABLET | Freq: Four times a day (QID) | ORAL | 0 refills | Status: AC | PRN

## 2023-12-07 NOTE — ED Triage Notes (Signed)
 Pt sts left upper dental pain x 4 days

## 2023-12-07 NOTE — Discharge Instructions (Signed)
 You have been given a shot of Toradol 30 mg today.  Ketorolac 10 mg tablets--take 1 tablet every 6 hours as needed for pain.  This is the same medicine that is in the shot we just gave you  Take amoxicillin-clavulanate 875 mg--1 tab twice daily with food for 7 days

## 2023-12-07 NOTE — ED Provider Notes (Signed)
 [facility]    CSN: [id number] Arrival date & time: 12/07/23  0919      History   Chief Complaint Chief Complaint  Patient presents with   Dental Pain    HPI [name] is a 38 y.o. female.    Dental Pain Here for pain in her left upper mouth and teeth.  She has also had some swelling in the area.  No fever or chills.   NKDA  Last menstrual cycle was in early September.    Past Medical History:  Diagnosis Date   Abnormal Pap smear    HPV   Anemia    Headache(784.0)    Postpartum hemorrhage     Patient Active Problem List   Diagnosis Date Noted   Elevated blood pressure 06/25/2015   S/P cesarean section 03/06/2015   Migraine 08/22/2012    Past Surgical History:  Procedure Laterality Date   CESAREAN SECTION N/A 03/06/2015   Procedure: PRIMARY CESAREAN SECTION;  Surgeon: Carlin DELENA Centers, MD;  Location: [facility];  Service: Obstetrics;  Laterality: N/A;   DILATION AND EVACUATION N/A 06/11/2012   Procedure: DILATATION AND EVACUATION;  Surgeon: Aida DELENA Na, MD;  Location: [facility];  Service: Gynecology;  Laterality: N/A;  with insertion Bakri Balloon  - Emergency   NO PAST SURGERIES     WISDOM TOOTH EXTRACTION      OB History     Gravida  5   Para  4   Term  4   Preterm  0   AB  1   Living  4      SAB  1   IAB  0   Ectopic  0   Multiple  0   Live Births  4            Home Medications    Prior to Admission medications   Medication Sig Start Date End Date Taking? Authorizing Provider  amoxicillin -clavulanate (AUGMENTIN ) 875-125 MG tablet Take 1 tablet by mouth 2 (two) times daily for 7 days. 12/07/23 12/14/23 Yes Vonna Sharlet POUR, MD  ketorolac  (TORADOL ) 10 MG tablet Take 1 tablet (10 mg total) by mouth every 6 (six) hours as needed (pain). 12/07/23  Yes Vonna Sharlet POUR, MD  diphenhydrAMINE  (BENADRYL ) 25 mg capsule Take 25 mg by mouth every 6 (six) hours as needed.    [provider]    Family  History Family History  Problem Relation Age of Onset   Hypertension Father    Heart disease Father    Kidney disease Father    Hearing loss Neg Hx     Social History Social History   Tobacco Use   Smoking status: Light Smoker    Current packs/day: 0.25    Average packs/day: 0.3 packs/day for 7.0 years (1.8 ttl pk-yrs)    Types: Cigarettes   Smokeless tobacco: Never  Vaping Use   Vaping status: Never Used  Substance Use Topics   Alcohol use: No    Alcohol/week: 0.0 standard drinks of alcohol   Drug use: No     Allergies   Patient has no known allergies.   Review of Systems Review of Systems   Physical Exam Triage Vital Signs ED Triage Vitals [12/07/23 1134]  Encounter Vitals Group     BP 137/76     Girls Systolic BP Percentile      Girls Diastolic BP Percentile      Boys Systolic BP Percentile      Boys Diastolic  BP Percentile      Pulse Rate 82     Resp 18     Temp 98 F (36.7 C)     Temp Source Oral     SpO2 98 %     Weight      Height      Head Circumference      Peak Flow      Pain Score 6     Pain Loc      Pain Education      Exclude from Growth Chart    No data found.  Updated Vital Signs BP 137/76 (BP Location: Right Arm)   Pulse 82   Temp 98 F (36.7 C) (Oral)   Resp 18   SpO2 98%   Visual Acuity Right Eye Distance:   Left Eye Distance:   Bilateral Distance:    Right Eye Near:   Left Eye Near:    Bilateral Near:     Physical Exam Vitals reviewed.  Constitutional:      General: She is not in acute distress.    Appearance: She is not ill-appearing, toxic-appearing or diaphoretic.  HENT:     Mouth/Throat:     Mouth: Mucous membranes are moist.     Comments: There is swelling and induration over the external left upper lip.  No fluctuance.  No erythema.  It is tender in the area.  In the mouth there are some broken teeth and caries. Eyes:     Extraocular Movements: Extraocular movements intact.     Pupils: Pupils are  equal, round, and reactive to light.  Cardiovascular:     Rate and Rhythm: Normal rate and regular rhythm.     Heart sounds: No murmur heard. Pulmonary:     Effort: Pulmonary effort is normal.     Breath sounds: Normal breath sounds.  Musculoskeletal:     Cervical back: Neck supple.  Lymphadenopathy:     Cervical: No cervical adenopathy.  Skin:    Coloration: Skin is not pale.  Neurological:     General: No focal deficit present.     Mental Status: She is alert and oriented to person, place, and time.  Psychiatric:        Behavior: Behavior normal.      UC Treatments / Results  Labs (all labs ordered are listed, but only abnormal results are displayed) Labs Reviewed - No data to display  EKG   Radiology No results found.  Procedures Procedures (including critical care time)  Medications Ordered in UC Medications  ketorolac  (TORADOL ) 30 MG/ML injection 30 mg (has no administration in time range)    Initial Impression / Assessment and Plan / UC Course  I have reviewed the triage vital signs and the nursing notes.  Pertinent labs & imaging results that were available during my care of the patient were reviewed by me and considered in my medical decision making (see chart for details).     For the dental infection.  She is given a list of requested providers. Final Clinical Impressions(s) / UC Diagnoses   Final diagnoses:  Dental infection     Discharge Instructions      You have been given a shot of Toradol  30 mg today.  Ketorolac  10 mg tablets--take 1 tablet every 6 hours as needed for pain.  This is the same medicine that is in the shot we just gave you  Take amoxicillin -clavulanate 875 mg--1 tab twice daily with food for 7 days  ED Prescriptions     Medication Sig Dispense Auth. Provider   ketorolac  (TORADOL ) 10 MG tablet Take 1 tablet (10 mg total) by mouth every 6 (six) hours as needed (pain). 20 tablet Cathryn Gallery K, MD    amoxicillin -clavulanate (AUGMENTIN ) 875-125 MG tablet Take 1 tablet by mouth 2 (two) times daily for 7 days. 14 tablet Keon Pender K, MD      I have reviewed the PDMP during this encounter.   Vonna Sharlet POUR, MD 12/07/23 [phone]
